# Patient Record
Sex: Female | Born: 1985 | Race: Black or African American | Hispanic: No | Marital: Single | Smoking: Former smoker
Health system: Southern US, Community
[De-identification: ages and names within clinical notes are randomized; demographics above are authoritative.]

## PROBLEM LIST (undated history)

## (undated) ENCOUNTER — Emergency Department (HOSPITAL_COMMUNITY): Admission: EM | Disposition: A | Discharge status: Home / Self Care | Payer: 59

## (undated) DIAGNOSIS — Q613 Polycystic kidney, unspecified: Secondary | ICD-10-CM

## (undated) DIAGNOSIS — I10 Essential (primary) hypertension: Secondary | ICD-10-CM

---
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
## 2007-05-27 ENCOUNTER — Emergency Department (HOSPITAL_COMMUNITY): Admission: EM | Admit: 2007-05-27 | Discharge: 2007-05-27 | Payer: Self-pay | Admitting: Emergency Medicine

## 2007-08-02 ENCOUNTER — Emergency Department (HOSPITAL_COMMUNITY): Admission: EM | Admit: 2007-08-02 | Discharge: 2007-08-02 | Payer: Self-pay | Admitting: Emergency Medicine

## 2008-06-08 ENCOUNTER — Emergency Department (HOSPITAL_COMMUNITY): Admission: EM | Admit: 2008-06-08 | Discharge: 2008-06-08 | Payer: Self-pay | Admitting: Emergency Medicine

## 2008-09-12 ENCOUNTER — Emergency Department (HOSPITAL_COMMUNITY): Admission: EM | Admit: 2008-09-12 | Discharge: 2008-09-12 | Payer: Self-pay | Admitting: Family Medicine

## 2009-02-04 ENCOUNTER — Emergency Department (HOSPITAL_COMMUNITY): Admission: EM | Admit: 2009-02-04 | Discharge: 2009-02-05 | Payer: Self-pay | Admitting: Emergency Medicine

## 2009-05-19 ENCOUNTER — Emergency Department (HOSPITAL_COMMUNITY): Admission: EM | Admit: 2009-05-19 | Discharge: 2009-05-19 | Payer: Self-pay | Admitting: Emergency Medicine

## 2009-07-03 ENCOUNTER — Inpatient Hospital Stay (HOSPITAL_COMMUNITY): Admission: AD | Admit: 2009-07-03 | Discharge: 2009-07-04 | Payer: Self-pay | Admitting: Obstetrics & Gynecology

## 2010-11-02 LAB — WET PREP, GENITAL: Trich, Wet Prep: NONE SEEN

## 2010-11-02 LAB — URINALYSIS, ROUTINE W REFLEX MICROSCOPIC
Leukocytes, UA: NEGATIVE
Urobilinogen, UA: 0.2 mg/dL (ref 0.0–1.0)

## 2010-11-02 LAB — URINE MICROSCOPIC-ADD ON

## 2010-11-04 LAB — URINALYSIS, ROUTINE W REFLEX MICROSCOPIC
Glucose, UA: NEGATIVE mg/dL
Nitrite: NEGATIVE
Specific Gravity, Urine: 1.01 (ref 1.005–1.030)
pH: 6 (ref 5.0–8.0)

## 2010-11-04 LAB — COMPREHENSIVE METABOLIC PANEL
Calcium: 9.2 mg/dL (ref 8.4–10.5)
Creatinine, Ser: 0.77 mg/dL (ref 0.4–1.2)
GFR calc Af Amer: >60 mL/min (ref >60)
Glucose, Bld: 83 mg/dL (ref 70–99)
Potassium: 4 mEq/L (ref 3.5–5.1)
Sodium: 138 mEq/L (ref 135–145)
Total Protein: 7.4 g/dL (ref 6.0–8.3)

## 2010-11-04 LAB — CBC
Hemoglobin: 13.3 g/dL (ref 12.0–15.0)
RBC: 4.42 MIL/uL (ref 3.87–5.11)

## 2010-11-04 LAB — URINE MICROSCOPIC-ADD ON

## 2010-11-04 LAB — LIPASE, BLOOD: Lipase: 25 U/L (ref 11–59)

## 2010-11-04 LAB — DIFFERENTIAL
Basophils Relative: 0 % (ref 0–1)
Monocytes Relative: 6 % (ref 3–12)
Neutrophils Relative %: 66 % (ref 43–77)

## 2010-11-07 LAB — DIFFERENTIAL
Eosinophils Relative: 2 % (ref 0–5)
Lymphocytes Relative: 23 % (ref 12–46)
Lymphs Abs: 2 10*3/uL (ref 0.7–4.0)
Monocytes Relative: 6 % (ref 3–12)

## 2010-11-07 LAB — URINALYSIS, ROUTINE W REFLEX MICROSCOPIC
Glucose, UA: NEGATIVE mg/dL
Ketones, ur: NEGATIVE mg/dL
pH: 6 (ref 5.0–8.0)

## 2010-11-07 LAB — CBC
HCT: 34.4 % — ABNORMAL LOW (ref 36.0–46.0)
Hemoglobin: 11.6 g/dL — ABNORMAL LOW (ref 12.0–15.0)
MCV: 88 fL (ref 78.0–100.0)
Platelets: 190 10*3/uL (ref 150–400)
RBC: 3.91 MIL/uL (ref 3.87–5.11)
WBC: 8.5 10*3/uL (ref 4.0–10.5)

## 2010-12-30 ENCOUNTER — Emergency Department (HOSPITAL_COMMUNITY): Payer: Self-pay

## 2010-12-30 ENCOUNTER — Emergency Department (HOSPITAL_COMMUNITY)
Admission: EM | Admit: 2010-12-30 | Discharge: 2010-12-31 | Disposition: A | Discharge status: Home / Self Care | Payer: Self-pay | Attending: Emergency Medicine | Admitting: Emergency Medicine

## 2010-12-30 DIAGNOSIS — R079 Chest pain, unspecified: Secondary | ICD-10-CM | POA: Insufficient documentation

## 2010-12-30 DIAGNOSIS — R0602 Shortness of breath: Secondary | ICD-10-CM | POA: Insufficient documentation

## 2010-12-30 DIAGNOSIS — R0989 Other specified symptoms and signs involving the circulatory and respiratory systems: Secondary | ICD-10-CM | POA: Insufficient documentation

## 2010-12-30 DIAGNOSIS — J45909 Unspecified asthma, uncomplicated: Secondary | ICD-10-CM | POA: Insufficient documentation

## 2010-12-30 DIAGNOSIS — R0609 Other forms of dyspnea: Secondary | ICD-10-CM | POA: Insufficient documentation

## 2010-12-30 DIAGNOSIS — M546 Pain in thoracic spine: Secondary | ICD-10-CM | POA: Insufficient documentation

## 2010-12-30 LAB — URINALYSIS, ROUTINE W REFLEX MICROSCOPIC
Hgb urine dipstick: NEGATIVE
Nitrite: NEGATIVE
Specific Gravity, Urine: 1.015 (ref 1.005–1.030)
Urobilinogen, UA: 0.2 mg/dL (ref 0.0–1.0)

## 2010-12-30 LAB — URINE MICROSCOPIC-ADD ON

## 2010-12-30 LAB — POCT PREGNANCY, URINE: Preg Test, Ur: NEGATIVE

## 2011-04-21 LAB — URINALYSIS, ROUTINE W REFLEX MICROSCOPIC
Bilirubin Urine: NEGATIVE
Glucose, UA: NEGATIVE
Hgb urine dipstick: NEGATIVE
Protein, ur: NEGATIVE

## 2011-04-21 LAB — COMPREHENSIVE METABOLIC PANEL
Albumin: 3.8
BUN: 7
Creatinine, Ser: 0.9
Total Bilirubin: 0.8
Total Protein: 7

## 2011-04-21 LAB — CBC
HCT: 37.1
MCV: 85
Platelets: 244
RDW: 15.2

## 2011-04-21 LAB — DIFFERENTIAL
Basophils Absolute: 0
Lymphocytes Relative: 14
Monocytes Absolute: 0.4
Monocytes Relative: 4
Neutro Abs: 7.2
Neutrophils Relative %: 81 — ABNORMAL HIGH

## 2011-05-03 LAB — DIFFERENTIAL
Basophils Absolute: 0
Basophils Relative: 0
Eosinophils Absolute: 0.1
Eosinophils Relative: 2
Lymphocytes Relative: 24
Lymphs Abs: 1.3
Monocytes Absolute: 0.4
Monocytes Relative: 7
Neutro Abs: 3.7
Neutrophils Relative %: 67

## 2011-05-03 LAB — URINALYSIS, ROUTINE W REFLEX MICROSCOPIC
Bilirubin Urine: NEGATIVE
Glucose, UA: NEGATIVE
Hgb urine dipstick: NEGATIVE
Ketones, ur: NEGATIVE
Nitrite: NEGATIVE
Protein, ur: NEGATIVE
Specific Gravity, Urine: 1.02
Urobilinogen, UA: 1
pH: 7.5

## 2011-05-03 LAB — URINE CULTURE: Colony Count: 9000

## 2011-05-03 LAB — CBC
Hemoglobin: 11.9 — ABNORMAL LOW
MCHC: 33.3
RBC: 4.08
WBC: 5.6

## 2011-05-03 LAB — POCT PREGNANCY, URINE: Preg Test, Ur: NEGATIVE

## 2011-05-03 LAB — BASIC METABOLIC PANEL
CO2: 26
Calcium: 9
GFR calc Af Amer: >60
GFR calc non Af Amer: >60
Sodium: 135

## 2011-05-03 LAB — WET PREP, GENITAL: Yeast Wet Prep HPF POC: NONE SEEN

## 2011-05-03 LAB — GC/CHLAMYDIA PROBE AMP, GENITAL: GC Probe Amp, Genital: NEGATIVE

## 2011-05-11 LAB — PREGNANCY, URINE: Preg Test, Ur: NEGATIVE

## 2011-05-11 LAB — URINALYSIS, ROUTINE W REFLEX MICROSCOPIC
Ketones, ur: 40 — AB
Leukocytes, UA: NEGATIVE
Nitrite: NEGATIVE
Specific Gravity, Urine: 1.03
Urobilinogen, UA: 1
pH: 6

## 2011-05-11 LAB — BASIC METABOLIC PANEL
BUN: 7
CO2: 25
Calcium: 9.2
Chloride: 109
Creatinine, Ser: 0.85
GFR calc Af Amer: >60

## 2011-05-11 LAB — URINE MICROSCOPIC-ADD ON

## 2011-10-28 ENCOUNTER — Encounter (HOSPITAL_COMMUNITY): Payer: Self-pay | Admitting: *Deleted

## 2011-10-28 ENCOUNTER — Emergency Department (INDEPENDENT_AMBULATORY_CARE_PROVIDER_SITE_OTHER)
Admission: EM | Admit: 2011-10-28 | Discharge: 2011-10-28 | Disposition: A | Discharge status: Home / Self Care | Payer: BC Managed Care – PPO | Source: Home / Self Care | Attending: Emergency Medicine | Admitting: Emergency Medicine

## 2011-10-28 DIAGNOSIS — J329 Chronic sinusitis, unspecified: Secondary | ICD-10-CM

## 2011-10-28 HISTORY — DX: Essential (primary) hypertension: I10

## 2011-10-28 LAB — POCT RAPID STREP A: Streptococcus, Group A Screen (Direct): NEGATIVE

## 2011-10-28 MED ORDER — AMOXICILLIN 500 MG PO CAPS: 500.0000 mg | ORAL_CAPSULE | Freq: Three times a day (TID) | ORAL | Status: AC

## 2011-10-28 MED ORDER — AMOXICILLIN 500 MG PO CAPS: 500.0000 mg | ORAL_CAPSULE | Freq: Three times a day (TID) | ORAL | Status: DC

## 2011-10-28 NOTE — ED Provider Notes (Signed)
Medical screening examination/treatment/procedure(s) were performed by non-physician practitioner and as supervising physician I was immediately available for consultation/collaboration.  Leslee Home, M.D.   Reuben Likes, MD 10/28/11 2200

## 2011-10-28 NOTE — ED Provider Notes (Signed)
History     CSN: [id number]  Arrival date & time 10/28/11  1324   None     Chief Complaint  Patient presents with  . Sore Throat    (Consider location/radiation/quality/duration/timing/severity/associated sxs/prior treatment) Patient is a 26 y.o. female presenting with pharyngitis. The history is provided by the patient. No language interpreter was used.  Sore Throat This is a new problem. The current episode started more than 2 days ago. The problem occurs constantly. The problem has been gradually worsening. Pertinent negatives include no headaches. The symptoms are aggravated by nothing. The symptoms are relieved by nothing. She has tried nothing for the symptoms. The treatment provided no relief.  Pt complains of a sore throat and a cough.  Pt reports she has drainage from sinuses.  Pt losing voice  Past Medical History  Diagnosis Date  . Hypertension   . Asthma     History reviewed. No pertinent past surgical history.  History reviewed. No pertinent family history.  History  Substance Use Topics  . Smoking status: Current Some Day Smoker  . Smokeless tobacco: Not on file  . Alcohol Use: No    OB History    Grav Para Term Preterm Abortions TAB SAB Ect Mult Living                  Review of Systems  HENT: Positive for sore throat.   Neurological: Negative for headaches.  All other systems reviewed and are negative.    Allergies  Review of patient's allergies indicates no known allergies.  Home Medications  No current outpatient prescriptions on file.  BP 150/91  Pulse 80  Temp(Src) 97.9 F (36.6 C) (Oral)  Resp 14  SpO2 99%  LMP 10/05/2011  Physical Exam  Nursing note and vitals reviewed. Constitutional: She is oriented to person, place, and time. She appears well-developed and well-nourished.  HENT:  Head: Normocephalic and atraumatic.  Right Ear: External ear normal.  Left Ear: External ear normal.  Nose: Nose normal.  Mouth/Throat:  Oropharynx is clear and moist.       Throat erythematous,  No exudate,  Sinuses tender,  Eyes: Conjunctivae are normal. Pupils are equal, round, and reactive to light.  Neck: Neck supple.  Cardiovascular: Normal rate.   Pulmonary/Chest: Effort normal.  Abdominal: Soft.  Musculoskeletal: Normal range of motion.  Neurological: She is alert and oriented to person, place, and time.  Skin: Skin is warm.  Psychiatric: She has a normal mood and affect.    ED Course  Procedures (including critical care time)   Labs Reviewed  POCT RAPID STREP A ([facility] ONLY)   No results found.   No diagnosis found.    MDM  Rx for amoxicillian        Lonia Skinner Bradner, PA 10/28/11 [address] [place], [place] 10/28/11 [phone]

## 2011-10-28 NOTE — ED Notes (Signed)
Pt  Reports  Symptoms  Of  sorethroat  Hoarse voices  body  aches  And  A  Cough    X  3  Days     She  Is  Masked  And  In  A  pvt  Room  She  Is  Awake  And  Alert  Sitting  Upright on  Exam table

## 2011-10-28 NOTE — Discharge Instructions (Signed)
Sinusitis Sinuses are air pockets within the bones of your face. The growth of bacteria within a sinus leads to infection. The infection prevents the sinuses from draining. This infection is called sinusitis. SYMPTOMS  There will be different areas of pain depending on which sinuses have become infected.  The maxillary sinuses often produce pain beneath the eyes.   Frontal sinusitis may cause pain in the middle of the forehead and above the eyes.  Other problems (symptoms) include:  Toothaches.   Colored, pus-like (purulent) drainage from the nose.   Swelling, warmth, and tenderness over the sinus areas may be signs of infection.  TREATMENT  Sinusitis is most often determined by an exam.X-rays may be taken. If x-rays have been taken, make sure you obtain your results or find out how you are to obtain them. Your caregiver may give you medications (antibiotics). These are medications that will help kill the bacteria causing the infection. You may also be given a medication (decongestant) that helps to reduce sinus swelling.  HOME CARE INSTRUCTIONS   Only take over-the-counter or prescription medicines for pain, discomfort, or fever as directed by your caregiver.   Drink extra fluids. Fluids help thin the mucus so your sinuses can drain more easily.   Applying either moist heat or ice packs to the sinus areas may help relieve discomfort.   Use saline nasal sprays to help moisten your sinuses. The sprays can be found at your local drugstore.  SEEK IMMEDIATE MEDICAL CARE IF:  You have a fever.   You have increasing pain, severe headaches, or toothache.   You have nausea, vomiting, or drowsiness.   You develop unusual swelling around the face or trouble seeing.  MAKE SURE YOU:   Understand these instructions.   Will watch your condition.   Will get help right away if you are not doing well or get worse.  Document Released: 07/18/2005 Document Revised: 07/07/2011 Document Reviewed:  02/14/2007 [employer] Patient Information 2012 [employer].Sinusitis Sinuses are air pockets within the bones of your face. The growth of bacteria within a sinus leads to infection. The infection prevents the sinuses from draining. This infection is called sinusitis. SYMPTOMS  There will be different areas of pain depending on which sinuses have become infected.  The maxillary sinuses often produce pain beneath the eyes.   Frontal sinusitis may cause pain in the middle of the forehead and above the eyes.  Other problems (symptoms) include:  Toothaches.   Colored, pus-like (purulent) drainage from the nose.   Swelling, warmth, and tenderness over the sinus areas may be signs of infection.  TREATMENT  Sinusitis is most often determined by an exam.X-rays may be taken. If x-rays have been taken, make sure you obtain your results or find out how you are to obtain them. Your caregiver may give you medications (antibiotics). These are medications that will help kill the bacteria causing the infection. You may also be given a medication (decongestant) that helps to reduce sinus swelling.  HOME CARE INSTRUCTIONS   Only take over-the-counter or prescription medicines for pain, discomfort, or fever as directed by your caregiver.   Drink extra fluids. Fluids help thin the mucus so your sinuses can drain more easily.   Applying either moist heat or ice packs to the sinus areas may help relieve discomfort.   Use saline nasal sprays to help moisten your sinuses. The sprays can be found at your local drugstore.  SEEK IMMEDIATE MEDICAL CARE IF:  You have a fever.     You have increasing pain, severe headaches, or toothache.   You have nausea, vomiting, or drowsiness.   You develop unusual swelling around the face or trouble seeing.  MAKE SURE YOU:   Understand these instructions.   Will watch your condition.   Will get help right away if you are not doing well or get worse.  Document  Released: 07/18/2005 Document Revised: 07/07/2011 Document Reviewed: 02/14/2007 [employer] Patient Information 2012 [employer]. 

## 2011-11-26 ENCOUNTER — Emergency Department (INDEPENDENT_AMBULATORY_CARE_PROVIDER_SITE_OTHER): Payer: BC Managed Care – PPO

## 2011-11-26 ENCOUNTER — Encounter (HOSPITAL_COMMUNITY): Payer: Self-pay

## 2011-11-26 ENCOUNTER — Emergency Department (INDEPENDENT_AMBULATORY_CARE_PROVIDER_SITE_OTHER)
Admission: EM | Admit: 2011-11-26 | Discharge: 2011-11-26 | Disposition: A | Discharge status: Home / Self Care | Payer: BC Managed Care – PPO | Source: Home / Self Care

## 2011-11-26 DIAGNOSIS — M545 Low back pain, unspecified: Secondary | ICD-10-CM

## 2011-11-26 HISTORY — DX: Polycystic kidney, unspecified: Q61.3

## 2011-11-26 LAB — POCT PREGNANCY, URINE: Preg Test, Ur: NEGATIVE

## 2011-11-26 MED ORDER — MORPHINE SULFATE 2 MG/ML IJ SOLN
2.0000 mg | Freq: Once | INTRAMUSCULAR | Status: AC
  Administered 2011-11-26: 2 mg via INTRAMUSCULAR

## 2011-11-26 MED ORDER — OXYCODONE-ACETAMINOPHEN 5-325 MG PO TABS: 1.0000 | ORAL_TABLET | ORAL | Status: AC | PRN

## 2011-11-26 MED ORDER — CYCLOBENZAPRINE HCL 10 MG PO TABS: 10.0000 mg | ORAL_TABLET | Freq: Three times a day (TID) | ORAL | Status: AC | PRN

## 2011-11-26 MED ORDER — MELOXICAM 15 MG PO TABS: 15.0000 mg | ORAL_TABLET | Freq: Every day | ORAL | Status: AC

## 2011-11-26 MED ORDER — MORPHINE SULFATE 2 MG/ML IJ SOLN
INTRAMUSCULAR | Status: AC
  Filled 2011-11-26: qty 1

## 2011-11-26 NOTE — ED Provider Notes (Signed)
Abigail Mora is a 26 y.o. female who presents to Urgent Care today for low back pain today starting at 1240.  Patient had acute onset of low back pain that does not radiate today. She denies any recent injury.  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  As a child she had back pain on and off that was never evaluated. Today for her pain she has tried Tylenol but that was not helpful. She denies any fevers chills weakness numbness difficulty walking or bowel or bladder dysfunction. She notes that having a hip flexed and back flexed position is most comfortable. Her pain is worse with back extension.   PMH reviewed. As noted above history of back pain on and off as a child that was never evaluated. Current smoker ROS as above otherwise neg.  no chest pains, palpitations, fevers, chills, abdominal pain nausea or vomiting. Medications reviewed. Current Facility-Administered Medications  Medication Dose Route Frequency Provider Last Rate Last Dose  . morphine 2 MG/ML injection 2 mg  2 mg Intramuscular Once Rodolph Bong, MD   2 mg at 11/26/11 1826   Current Outpatient Prescriptions  Medication Sig Dispense Refill  . acetaminophen (TYLENOL) 500 MG tablet Take 500 mg by mouth every 6 (six) hours as needed.      . cyclobenzaprine (FLEXERIL) 10 MG tablet Take 1 tablet (10 mg total) by mouth 3 (three) times daily as needed for muscle spasms.  30 tablet  0  . meloxicam (MOBIC) 15 MG tablet Take 1 tablet (15 mg total) by mouth daily.  14 tablet  0  . oxyCODONE-acetaminophen (ROXICET) 5-325 MG per tablet Take 1 tablet by mouth every 4 (four) hours as needed for pain.  15 tablet  0    Exam:  BP 115/70  Pulse 70  Temp(Src) 98.2 F (36.8 C) (Oral)  Resp 22  SpO2 99%  LMP 11/09/2011 Gen: Well NAD, in pain appearing BACK: Patient is laying on the exam table with her hips flexed.  Tender over lower lumbar and upper sacral midline. Additionally tender over the bilateral SI joints. Reflexes are normal and  equal bilaterally as is strength and sensation. Patient walks with a back flexed position  Results for orders placed during the hospital encounter of 11/26/11 (from the past 24 hour(s))  POCT PREGNANCY, URINE     Status: Normal   Collection Time   11/26/11  6:21 PM      Component Value Range   Preg Test, Ur NEGATIVE  NEGATIVE    Dg Lumbar Spine Complete  11/26/2011  *RADIOLOGY REPORT*  Clinical Data: Sudden onset low back pain today.  No known injury.  LUMBAR SPINE - COMPLETE 4+ VIEW  Comparison: Abdomen and pelvis CT dated 05/27/2007.  Findings: Transitional thoracolumbar and lumbosacral vertebrae with four intervening lumbar vertebrae.  These have normal appearances with the exception of mildly progressive discogenic sclerosis in the anterior, inferior aspect of the last lumbar vertebra prior to the transitional lumbosacral vertebra.  No fractures or subluxations.  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 Otherwise, unremarkable examination.  Original Report Authenticated By: Darrol Angel, M.D.    Assessment and Plan: 26 y.o. female with lumbar back pain.  X-rays were not diagnostic. She was tender over the midline therefore felt that x-rays are warranted. With rule out a fracture or serious spondylolisthesis I feel that the most likely etiology is muscle spasm. Plan to treat symptomatically with meloxicam, Flexeril, and small amount of  oxycodone. I've advised her to followup with her primary care doctor within one or 2 weeks especially if she does not improved. Discussed warning signs or symptoms with patient that would prompt return to health care. She expresses understanding. Please see discharge instructions.     Rodolph Bong, MD 11/26/11 [phone]

## 2011-11-26 NOTE — ED Notes (Signed)
Pt has low back pain that started at 1230 today, pt was sitting and no know injury

## 2011-11-26 NOTE — Discharge Instructions (Signed)
Thank you for coming in today. I think you have a back strain.  Please take the meloxicam and Flexeril.  Use the Percocet as needed for extreme back pain.  Take it easy, but don't lay in bed all day.  Followup with your doctor in one or 2 weeks if you do not get better. Come back or go to the emergency room if you notice new weakness new numbness problems walking or bowel or bladder problems.  Back Pain, Adult Low back pain is very common. About 1 in 5 people have back pain.The cause of low back pain is rarely dangerous. The pain often gets better over time.About half of people with a sudden onset of back pain feel better in just 2 weeks. About 8 in 10 people feel better by 6 weeks.  CAUSES Some common causes of back pain include:  Strain of the muscles or ligaments supporting the spine.   Wear and tear (degeneration) of the spinal discs.   Arthritis.   Direct injury to the back.  DIAGNOSIS Most of the time, the direct cause of low back pain is not known.However, back pain can be treated effectively even when the exact cause of the pain is unknown.Answering your caregiver's questions about your overall health and symptoms is one of the most accurate ways to make sure the cause of your pain is not dangerous. If your caregiver needs more information, he or she may order lab work or imaging tests (X-rays or MRIs).However, even if imaging tests show changes in your back, this usually does not require surgery. HOME CARE INSTRUCTIONS For many people, back pain returns.Since low back pain is rarely dangerous, it is often a condition that people can learn to [facility] their own.   Remain active. It is stressful on the back to sit or stand in one place. Do not sit, drive, or stand in one place for more than 30 minutes at a time. Take short walks on level surfaces as soon as pain allows.Try to increase the length of time you walk each day.   Do not stay in bed.Resting more than 1 or 2 days  can delay your recovery.   Do not avoid exercise or work.Your body is made to move.It is not dangerous to be active, even though your back may hurt.Your back will likely heal faster if you return to being active before your pain is gone.   Pay attention to your body when you bend and lift. Many people have less discomfortwhen lifting if they bend their knees, keep the load close to their bodies,and avoid twisting. Often, the most comfortable positions are those that put less stress on your recovering back.   Find a comfortable position to sleep. Use a firm mattress and lie on your side with your knees slightly bent. If you lie on your back, put a pillow under your knees.   Only take over-the-counter or prescription medicines as directed by your caregiver. Over-the-counter medicines to reduce pain and inflammation are often the most helpful.Your caregiver may prescribe muscle relaxant drugs.These medicines help dull your pain so you can more quickly return to your normal activities and healthy exercise.   Put ice on the injured area.   Put ice in a plastic bag.   Place a towel between your skin and the bag.   Leave the ice on for 15 to 20 minutes, 3 to 4 times a day for the first 2 to 3 days. After that, ice and heat may be  alternated to reduce pain and spasms.   Ask your caregiver about trying back exercises and gentle massage. This may be of some benefit.   Avoid feeling anxious or stressed.Stress increases muscle tension and can worsen back pain.It is important to recognize when you are anxious or stressed and learn ways to manage it.Exercise is a great option.  SEEK MEDICAL CARE IF:  You have pain that is not relieved with rest or medicine.   You have pain that does not improve in 1 week.   You have new symptoms.   You are generally not feeling well.  SEEK IMMEDIATE MEDICAL CARE IF:   You have pain that radiates from your back into your legs.   You develop new bowel or  bladder control problems.   You have unusual weakness or numbness in your arms or legs.   You develop nausea or vomiting.   You develop abdominal pain.   You feel faint.  Document Released: 07/18/2005 Document Revised: 07/07/2011 Document Reviewed: 12/06/2010 [facility] Patient Information 2012 [place], [place].

## 2011-11-26 NOTE — ED Provider Notes (Signed)
Medical screening examination/treatment/procedure(s) were performed by a resident physician and as supervising physician I was immediately available for consultation/collaboration.  Leslee Home, M.D.   Reuben Likes, MD 11/26/11 [phone]

## 2012-12-12 ENCOUNTER — Emergency Department (HOSPITAL_COMMUNITY)
Admission: EM | Admit: 2012-12-12 | Discharge: 2012-12-12 | Disposition: A | Discharge status: Home / Self Care | Payer: PRIVATE HEALTH INSURANCE | Attending: Emergency Medicine | Admitting: Emergency Medicine

## 2012-12-12 ENCOUNTER — Encounter (HOSPITAL_COMMUNITY): Payer: Self-pay | Admitting: Emergency Medicine

## 2012-12-12 DIAGNOSIS — H53149 Visual discomfort, unspecified: Secondary | ICD-10-CM | POA: Insufficient documentation

## 2012-12-12 DIAGNOSIS — R51 Headache: Secondary | ICD-10-CM | POA: Insufficient documentation

## 2012-12-12 DIAGNOSIS — Q613 Polycystic kidney, unspecified: Secondary | ICD-10-CM | POA: Insufficient documentation

## 2012-12-12 DIAGNOSIS — I10 Essential (primary) hypertension: Secondary | ICD-10-CM | POA: Insufficient documentation

## 2012-12-12 DIAGNOSIS — F17200 Nicotine dependence, unspecified, uncomplicated: Secondary | ICD-10-CM | POA: Insufficient documentation

## 2012-12-12 DIAGNOSIS — R630 Anorexia: Secondary | ICD-10-CM | POA: Insufficient documentation

## 2012-12-12 DIAGNOSIS — J45909 Unspecified asthma, uncomplicated: Secondary | ICD-10-CM | POA: Insufficient documentation

## 2012-12-12 MED ORDER — ACETAMINOPHEN 500 MG PO TABS: 500.0000 mg | ORAL_TABLET | Freq: Four times a day (QID) | ORAL | Status: DC | PRN

## 2012-12-12 MED ORDER — ACETAMINOPHEN 500 MG PO TABS
1000.0000 mg | ORAL_TABLET | Freq: Once | ORAL | Status: AC
  Administered 2012-12-12: 1000 mg via ORAL
  Filled 2012-12-12: qty 2

## 2012-12-12 MED ORDER — IBUPROFEN 800 MG PO TABS: 800.0000 mg | ORAL_TABLET | Freq: Three times a day (TID) | ORAL | Status: DC

## 2012-12-12 MED ORDER — IBUPROFEN 400 MG PO TABS
800.0000 mg | ORAL_TABLET | Freq: Once | ORAL | Status: AC
  Administered 2012-12-12: 800 mg via ORAL
  Filled 2012-12-12: qty 2

## 2012-12-12 NOTE — ED Notes (Signed)
Pt states she has a headache to both sides of head. Pt describes headache as pressure. Pt states she took aspirin and clonodine around 9pm. Pt rates headache 7/10. Pt states she has headaches but has never been dx with migraines. Pt reports sensitivity to light.

## 2012-12-12 NOTE — ED Notes (Signed)
PT. REPORTS HEADACHE ONSET 6 PM THIS EVENING , DENIES INJURY , SLIGHT NAUSEA , DENIES FEVER OR BLURRED VISION .

## 2012-12-12 NOTE — ED Provider Notes (Signed)
History    This chart was scribed for non-physician practitioner Junius Finner working with Celene Kras, MD by Quintella Reichert, ED Scribe. This patient was seen in room TR08C/TR08C and the patient's care was started at 10:47 PM .   CSN: [id number]  Arrival date & time 12/12/12  2140      Chief Complaint  Patient presents with  . Headache     The history is provided by the patient. No language interpreter was used.    HPI Comments: Abigail Mora is a 27 y.o. female who presents to the Emergency Department complaining of constant, severe headache that began 4 hours ago.  Pt states she has h/o intermittent headaches with accompanying high BP.  At admission pt's BP is 149/99.  She describes pain as pressure, "like someone is taking an air tire pump and filling up my head."  She states her current symptoms feel similar to past HA episodes.  She also reports mild photophobia and decreased appetite.  She mentions that she has been hydrating well today.  Pt took aspirin and clonidine 2 hours ago, with minimal relief.  Pt denies nausea, numbness, paraesthesia, fever or any other associated symptoms.. Pt has h/o HTN, polycystic kidney disease and asthma.  She denies any medications.   She denies medication allergies.  Pt is not driving home today.   Past Medical History  Diagnosis Date  . Hypertension   . Asthma   . Polycystic kidney disease     History reviewed. No pertinent past surgical history.  No family history on file.  History  Substance Use Topics  . Smoking status: Current Some Day Smoker  . Smokeless tobacco: Not on file  . Alcohol Use: Yes    OB History   Grav Para Term Preterm Abortions TAB SAB Ect Mult Living                  Review of Systems  Constitutional: Negative for fever.  Eyes: Positive for photophobia.  Gastrointestinal: Negative for nausea.  Neurological: Positive for headaches. Negative for numbness.  All other systems reviewed and are  negative.    Allergies  Review of patient's allergies indicates no known allergies.  Home Medications   Current Outpatient Rx  Name  Route  Sig  Dispense  Refill  . ibuprofen (ADVIL,MOTRIN) 200 MG tablet   Oral   Take 200 mg by mouth every 6 (six) hours as needed for pain. For headache pain         . acetaminophen (TYLENOL) 500 MG tablet   Oral   Take 1 tablet (500 mg total) by mouth every 6 (six) hours as needed for pain.   30 tablet   0   . ibuprofen (ADVIL,MOTRIN) 800 MG tablet   Oral   Take 1 tablet (800 mg total) by mouth 3 (three) times daily.   21 tablet   0     BP 149/99  Pulse 59  Temp(Src) 98.4 F (36.9 C) (Oral)  Resp 18  SpO2 100%  LMP 12/07/2012  Physical Exam  Nursing note and vitals reviewed. Constitutional: She is oriented to person, place, and time. She appears well-developed and well-nourished. No distress.  Pt lying in exam bed with towel over her eyes  HENT:  Head: Normocephalic and atraumatic.  Eyes: Conjunctivae and EOM are normal. Pupils are equal, round, and reactive to light. Right eye exhibits no discharge. Left eye exhibits no discharge. No scleral icterus.  PERRL, EOM nl  Neck:  Normal range of motion. Neck supple. No tracheal deviation present.  No nuchal rigidity or meningeal signs  Cardiovascular: Normal rate, regular rhythm and normal heart sounds.   No murmur heard. Pulmonary/Chest: Effort normal and breath sounds normal. No respiratory distress. She has no wheezes. She has no rales.  Musculoskeletal: Normal range of motion.  Neurological: She is alert and oriented to person, place, and time. She has normal strength. No cranial nerve deficit or sensory deficit. She exhibits normal muscle tone. Coordination and gait normal.  CN II-XII in tact, no focal deficit, nl gait  Skin: Skin is warm and dry. No rash noted.  Psychiatric: She has a normal mood and affect. Her behavior is normal.    ED Course  Procedures (including critical  care time)  DIAGNOSTIC STUDIES: Oxygen Saturation is 100% on room air, normal by my interpretation.    COORDINATION OF CARE: 10:50 PM-Discussed treatment plan which includes pain medication with pt at bedside and pt agreed to plan.      Labs Reviewed - No data to display No results found.   1. Headache       MDM  Pt c/o headache that started earlier today, similar to headaches in past.  Hast tried aspirin and clonidine w/ minimal relief.  Pt reports elevated BP at home and noticed it is associated with her headaches. BP here is 149/99.  Neuro exam: nl. CN II-XII in tact, nl coordination, able to ambulate. PERRL, EOM nl.  No nuchal rigidity or meningeal signs. During stay in ED, pt left her own room b/c person she was with was aggravating her.  Advised pt she needs to rest and try to keep stress under control.  It could be contributing to headaches and high bp.    Rx: ibuprofen and acetaminophen, discharged home. F/u with pcp as needed for headaches and regular health maintenance. I personally performed the services described in this documentation, which was scribed in my presence. The recorded information has been reviewed and is accurate. Vitals: unremarkable. Discharged in stable condition.       MDM Number of Diagnoses or Management Options Headache:             Junius Finner, PA-C 12/13/12 1349

## 2012-12-13 NOTE — ED Provider Notes (Signed)
Medical screening examination/treatment/procedure(s) were performed by non-physician practitioner and as supervising physician I was immediately available for consultation/collaboration.     Celene Kras, MD 12/13/12 [phone]

## 2012-12-18 ENCOUNTER — Institutional Professional Consult (permissible substitution): Payer: Self-pay | Admitting: Medical

## 2013-01-04 ENCOUNTER — Emergency Department (HOSPITAL_COMMUNITY)
Admission: EM | Admit: 2013-01-04 | Discharge: 2013-01-04 | Disposition: A | Discharge status: Home / Self Care | Payer: PRIVATE HEALTH INSURANCE | Source: Home / Self Care

## 2013-01-04 ENCOUNTER — Encounter (HOSPITAL_COMMUNITY): Payer: Self-pay | Admitting: *Deleted

## 2013-01-04 DIAGNOSIS — M7661 Achilles tendinitis, right leg: Secondary | ICD-10-CM

## 2013-01-04 DIAGNOSIS — M7660 Achilles tendinitis, unspecified leg: Secondary | ICD-10-CM

## 2013-01-04 MED ORDER — DICLOFENAC SODIUM 50 MG PO TBEC: 50.0000 mg | DELAYED_RELEASE_TABLET | Freq: Two times a day (BID) | ORAL | Status: DC

## 2013-01-04 NOTE — ED Notes (Signed)
Pt reports right ankle pain the past month

## 2013-01-04 NOTE — ED Notes (Signed)
Applied 2" coban wrap at verbal direction of NP after a determination that a gel/air splint would be unlikely to provide the limitation of movement indicated by the injury.

## 2013-01-04 NOTE — ED Provider Notes (Signed)
Medical screening examination/treatment/procedure(s) were performed by non-physician practitioner and as supervising physician I was immediately available for consultation/collaboration.  Raynald Blend, MD 01/04/13 1340

## 2013-01-04 NOTE — ED Provider Notes (Signed)
History     CSN: [id number]  Arrival date & time 01/04/13  1122   First MD Initiated Contact with Patient 01/04/13 1219      Chief Complaint  Patient presents with  . Ankle Pain    (Consider location/radiation/quality/duration/timing/severity/associated sxs/prior treatment) HPI Comments: 27 year old female reports pain in the right Achilles tendon for approximately one month. The pain is located along the mid posterior Achilles tendon on the right. Denies any known injury. She is not a runner nor has she been exercising. 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 In the past month this pain is been getting worse.   Past Medical History  Diagnosis Date  . Hypertension   . Asthma   . Polycystic kidney disease     No past surgical history on file.  No family history on file.  History  Substance Use Topics  . Smoking status: Current Some Day Smoker  . Smokeless tobacco: Not on file  . Alcohol Use: Yes     Comment: occasionally    OB History   Grav Para Term Preterm Abortions TAB SAB Ect Mult Living                  Review of Systems  Constitutional: Negative for fever, chills and activity change.  HENT: Negative.   Respiratory: Negative.   Cardiovascular: Negative.   Musculoskeletal: Positive for gait problem.       As per HPI  Skin: Negative for color change, pallor and rash.  Neurological: Negative.     Allergies  Review of patient's allergies indicates no known allergies.  Home Medications   Current Outpatient Rx  Name  Route  Sig  Dispense  Refill  . acetaminophen (TYLENOL) 500 MG tablet   Oral   Take 1 tablet (500 mg total) by mouth every 6 (six) hours as needed for pain.   30 tablet   0   . diclofenac (VOLTAREN) 50 MG EC tablet   Oral   Take 1 tablet (50 mg total) by mouth 2 (two) times daily. Take with food.   20 tablet   0   . ibuprofen (ADVIL,MOTRIN) 200 MG tablet   Oral   Take 200 mg by mouth every  6 (six) hours as needed for pain. For headache pain         . ibuprofen (ADVIL,MOTRIN) 800 MG tablet   Oral   Take 1 tablet (800 mg total) by mouth 3 (three) times daily.   21 tablet   0     BP 151/94  Pulse 77  Resp 17  SpO2 100%  LMP 12/09/2012  Physical Exam  Nursing note and vitals reviewed. Constitutional: She is oriented to person, place, and time. She appears well-developed and well-nourished. No distress.  HENT:  Head: Normocephalic and atraumatic.  Eyes: EOM are normal.  Neck: Normal range of motion. Neck supple.  Cardiovascular: Normal rate.   Pulmonary/Chest: Effort normal. No respiratory distress.  Musculoskeletal: She exhibits no edema.  No observable deformity, swelling or discoloration over the right Achilles tendon. Tenderness is primarily along the posterior aspect of the mid tendon. No plantar tenderness. Flexion and extension is intact. Distal neurovascular motor sensory is intact. Pedal pulse 2+.  Neurological: She is alert and oriented to person, place, and time. No cranial nerve deficit.  Skin: Skin is warm and dry.  Psychiatric: She has a normal mood and affect.    ED Course  Procedures (  including critical care time)  Labs Reviewed - No data to display No results found.   1. Achilles tendinitis, right       MDM  Coban type wrap to  Ankle to assist in  Immobilization and support. Recommend limited amount of time spent walking if possible. Ice to the area of pain along the Achilles tendon 4-5 times a day 20 to 30 minutes at a time. Diclofenac 50 mg 3 times a day with food when necessary pain Instructions for rehabilitation.        Hayden Rasmussen, NP 01/04/13 1242

## 2013-05-13 ENCOUNTER — Encounter (HOSPITAL_COMMUNITY): Payer: Self-pay | Admitting: Emergency Medicine

## 2013-05-13 ENCOUNTER — Emergency Department (HOSPITAL_COMMUNITY)
Admission: EM | Admit: 2013-05-13 | Discharge: 2013-05-13 | Disposition: A | Discharge status: Home / Self Care | Payer: PRIVATE HEALTH INSURANCE | Attending: Emergency Medicine | Admitting: Emergency Medicine

## 2013-05-13 ENCOUNTER — Emergency Department (HOSPITAL_COMMUNITY): Payer: PRIVATE HEALTH INSURANCE

## 2013-05-13 DIAGNOSIS — J069 Acute upper respiratory infection, unspecified: Secondary | ICD-10-CM

## 2013-05-13 DIAGNOSIS — J45901 Unspecified asthma with (acute) exacerbation: Secondary | ICD-10-CM | POA: Insufficient documentation

## 2013-05-13 DIAGNOSIS — Z791 Long term (current) use of non-steroidal anti-inflammatories (NSAID): Secondary | ICD-10-CM | POA: Insufficient documentation

## 2013-05-13 DIAGNOSIS — I10 Essential (primary) hypertension: Secondary | ICD-10-CM | POA: Insufficient documentation

## 2013-05-13 DIAGNOSIS — F17200 Nicotine dependence, unspecified, uncomplicated: Secondary | ICD-10-CM | POA: Insufficient documentation

## 2013-05-13 MED ORDER — ALBUTEROL SULFATE (5 MG/ML) 0.5% IN NEBU
5.0000 mg | INHALATION_SOLUTION | Freq: Once | RESPIRATORY_TRACT | Status: AC
  Administered 2013-05-13: 5 mg via RESPIRATORY_TRACT
  Filled 2013-05-13: qty 1

## 2013-05-13 MED ORDER — IPRATROPIUM BROMIDE 0.02 % IN SOLN
0.5000 mg | Freq: Once | RESPIRATORY_TRACT | Status: AC
  Administered 2013-05-13: 0.5 mg via RESPIRATORY_TRACT
  Filled 2013-05-13: qty 2.5

## 2013-05-13 MED ORDER — ALBUTEROL SULFATE HFA 108 (90 BASE) MCG/ACT IN AERS: 1.0000 | INHALATION_SPRAY | Freq: Four times a day (QID) | RESPIRATORY_TRACT | Status: DC | PRN

## 2013-05-13 MED ORDER — PROMETHAZINE-CODEINE 6.25-10 MG/5ML PO SYRP: 5.0000 mL | ORAL_SOLUTION | ORAL | Status: DC | PRN

## 2013-05-13 NOTE — ED Provider Notes (Signed)
CSN: [id number]     Arrival date & time 05/13/13  0701 History   First MD Initiated Contact with Patient 05/13/13 0710     Chief Complaint  Patient presents with  . URI   (Consider location/radiation/quality/duration/timing/severity/associated sxs/prior Treatment) Patient is a 27 y.o. female presenting with URI. The history is provided by the patient and medical records.  URI Presenting symptoms: congestion and cough    This is a 27 year old female with PMH significant for HTN, asthma, PCKD, presenting to the ED for productive cough, head and chest congestion.  No hemoptysis.  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  No fevers, sweats, or chills.  Has taken numerous OTC cold medicines and cough syrups without relief.  Pt states she has not used albuterol inhalers in several years.  Past Medical History  Diagnosis Date  . Hypertension   . Asthma   . Polycystic kidney disease    History reviewed. No pertinent past surgical history. No family history on file. History  Substance Use Topics  . Smoking status: Current Some Day Smoker  . Smokeless tobacco: Not on file  . Alcohol Use: Yes     Comment: occasionally   OB History   Grav Para Term Preterm Abortions TAB SAB Ect Mult Living                 Review of Systems  HENT: Positive for congestion and sinus pressure.   Respiratory: Positive for cough.   All other systems reviewed and are negative.    Allergies  Review of patient's allergies indicates no known allergies.  Home Medications   Current Outpatient Rx  Name  Route  Sig  Dispense  Refill  . acetaminophen (TYLENOL) 500 MG tablet   Oral   Take 1 tablet (500 mg total) by mouth every 6 (six) hours as needed for pain.   30 tablet   0   . diclofenac (VOLTAREN) 50 MG EC tablet   Oral   Take 1 tablet (50 mg total) by mouth 2 (two) times daily. Take with food.   20 tablet   0   . ibuprofen (ADVIL,MOTRIN) 200 MG tablet   Oral  Take 200 mg by mouth every 6 (six) hours as needed for pain. For headache pain         . ibuprofen (ADVIL,MOTRIN) 800 MG tablet   Oral   Take 1 tablet (800 mg total) by mouth 3 (three) times daily.   21 tablet   0    BP 168/90  Pulse 82  Temp(Src) 98.2 F (36.8 C)  Resp 20  SpO2 100%  Physical Exam  Nursing note and vitals reviewed. Constitutional: She is oriented to person, place, and time. She appears well-developed and well-nourished. No distress.  HENT:  Head: Normocephalic and atraumatic.  Right Ear: Tympanic membrane and ear canal normal.  Left Ear: Tympanic membrane and ear canal normal.  Nose: Mucosal edema present. Right sinus exhibits maxillary sinus tenderness. Left sinus exhibits maxillary sinus tenderness.  Mouth/Throat: Uvula is midline, oropharynx is clear and moist and mucous membranes are normal. No oropharyngeal exudate, posterior oropharyngeal edema, posterior oropharyngeal erythema or tonsillar abscesses.  Turbinates swollen and erythematous, some PND in posterior oropharynx  Eyes: Conjunctivae and EOM are normal. Pupils are equal, round, and reactive to light.  Neck: Normal range of motion. Neck supple.  Cardiovascular: Normal rate, regular rhythm and normal heart sounds.   Pulmonary/Chest: Effort normal. No respiratory distress. She  has wheezes.  Wheezes at bilateral bases  Musculoskeletal: Normal range of motion.  Neurological: She is alert and oriented to person, place, and time.  Skin: Skin is warm and dry. She is not diaphoretic.  Psychiatric: She has a normal mood and affect.    ED Course  Procedures (including critical care time) Labs Review Labs Reviewed - No data to display Imaging Review Dg Chest 2 View  05/13/2013   CLINICAL DATA:  Cough  EXAM: CHEST  2 VIEW  COMPARISON:  12/31/2010  FINDINGS: Normal cardiac and mediastinal contours. No consolidative pulmonary opacities. No effusion or pneumothorax. Regional skeleton is unremarkable.   IMPRESSION: No acute cardiopulmonary process.   Electronically Signed   By: Annia Belt M.D.   On: 05/13/2013 07:33    EKG Interpretation   None       MDM   1. Viral URI with cough    Chest x-ray clear. Likely viral URI. Wheezing improved following albuterol and Atrovent neb. Pt afebrile, non-toxic appearing, NAD, VS stable- ok for discharge.  Rx Phenergan with codeine syrup and albuterol inhaler. Patient will followup [facility] if problems occur. Discussed plan with patient, she agreed. Return precautions advised.   Garlon Hatchet, PA-C 05/13/13 [phone]

## 2013-05-13 NOTE — ED Provider Notes (Signed)
Medical screening examination/treatment/procedure(s) were performed by non-physician practitioner and as supervising physician I was immediately available for consultation/collaboration.  Darlys Gales, MD 05/13/13 2145

## 2013-05-13 NOTE — ED Notes (Addendum)
Cough congestion chest and facial x 1 week coughing up a lot of thick mucus OTC meds not helping

## 2013-06-14 ENCOUNTER — Encounter (HOSPITAL_COMMUNITY): Payer: Self-pay | Admitting: Emergency Medicine

## 2013-06-14 ENCOUNTER — Emergency Department (INDEPENDENT_AMBULATORY_CARE_PROVIDER_SITE_OTHER)
Admission: EM | Admit: 2013-06-14 | Discharge: 2013-06-14 | Disposition: A | Discharge status: Home / Self Care | Payer: PRIVATE HEALTH INSURANCE | Source: Home / Self Care | Attending: Emergency Medicine | Admitting: Emergency Medicine

## 2013-06-14 DIAGNOSIS — M549 Dorsalgia, unspecified: Secondary | ICD-10-CM

## 2013-06-14 MED ORDER — KETOROLAC TROMETHAMINE 30 MG/ML IJ SOLN
INTRAMUSCULAR | Status: AC
  Filled 2013-06-14: qty 1

## 2013-06-14 MED ORDER — NAPROXEN 500 MG PO TABS: 500.0000 mg | ORAL_TABLET | Freq: Two times a day (BID) | ORAL | Status: DC

## 2013-06-14 MED ORDER — TRAMADOL HCL 50 MG PO TABS: 50.0000 mg | ORAL_TABLET | Freq: Four times a day (QID) | ORAL | Status: DC | PRN

## 2013-06-14 MED ORDER — KETOROLAC TROMETHAMINE 60 MG/2ML IM SOLN
60.0000 mg | Freq: Once | INTRAMUSCULAR | Status: AC
  Administered 2013-06-14: 60 mg via INTRAMUSCULAR

## 2013-06-14 NOTE — ED Provider Notes (Signed)
CSN: [id number]     Arrival date & time 06/14/13  1611 History   First MD Initiated Contact with Patient 06/14/13 1703     Chief Complaint  Patient presents with  . Back Pain   (Consider location/radiation/quality/duration/timing/severity/associated sxs/prior Treatment) HPI Comments: 27 year old female presents complaining of back pain. This is been going on for one week. She had a viral upper respiratory infection a couple of weeks ago, during which time she developed diffuse back pain. Her respiratory infection has gotten better and and so has the upper back pain, but the lower back pain persists. The pain is constant and is exacerbated by any movement. She has had issues with her lower back before in the past. She denies any loss of bowel or bladder control and denies any numbness or weakness in her extremities  Patient is a 27 y.o. female presenting with back pain.  Back Pain Associated symptoms: no abdominal pain, no chest pain, no dysuria, no fever and no weakness     Past Medical History  Diagnosis Date  . Hypertension   . Asthma   . Polycystic kidney disease    History reviewed. No pertinent past surgical history. History reviewed. No pertinent family history. History  Substance Use Topics  . Smoking status: Current Some Day Smoker  . Smokeless tobacco: Not on file  . Alcohol Use: Yes     Comment: occasionally   OB History   Grav Para Term Preterm Abortions TAB SAB Ect Mult Living                 Review of Systems  Constitutional: Negative for fever and chills.  Eyes: Negative for visual disturbance.  Respiratory: Positive for cough. Negative for shortness of breath.   Cardiovascular: Negative for chest pain, palpitations and leg swelling.  Gastrointestinal: Negative for nausea, vomiting and abdominal pain.  Endocrine: Negative for polydipsia and polyuria.  Genitourinary: Negative for dysuria, urgency and frequency.  Musculoskeletal: Positive for back pain.  Negative for arthralgias and myalgias.  Skin: Negative for rash.  Neurological: Negative for dizziness, weakness and light-headedness.    Allergies  Review of patient's allergies indicates no known allergies.  Home Medications   Current Outpatient Rx  Name  Route  Sig  Dispense  Refill  . albuterol (PROVENTIL HFA;VENTOLIN HFA) 108 (90 BASE) MCG/ACT inhaler   Inhalation   Inhale 1-2 puffs into the lungs every 6 (six) hours as needed for wheezing.   1 Inhaler   0   . guaiFENesin (MUCINEX) 600 MG 12 hr tablet   Oral   Take 1,200 mg by mouth once.         . naproxen (NAPROSYN) 500 MG tablet   Oral   Take 1 tablet (500 mg total) by mouth 2 (two) times daily.   60 tablet   0   . promethazine-codeine (PHENERGAN WITH CODEINE) 6.25-10 MG/5ML syrup   Oral   Take 5 mLs by mouth every 4 (four) hours as needed for cough.   120 mL   0   . Pseudoephedrine HCl (SUDAFED PO)   Oral   Take 1 tablet by mouth once.         . traMADol (ULTRAM) 50 MG tablet   Oral   Take 1-2 tablets (50-100 mg total) by mouth every 6 (six) hours as needed.   30 tablet   0    BP 158/95  Pulse 84  Temp(Src) 99.2 F (37.3 C) (Oral)  Resp 14  SpO2 99%  LMP 05/29/2013 Physical Exam  Nursing note and vitals reviewed. Constitutional: She is oriented to person, place, and time. Vital signs are normal. She appears well-developed and well-nourished. No distress.  HENT:  Head: Normocephalic and atraumatic.  Pulmonary/Chest: Effort normal. No respiratory distress. She has wheezes (she is a smoker) in the right lower field and the left lower field.  Musculoskeletal:       Lumbar back: She exhibits decreased range of motion, tenderness, bony tenderness, pain and spasm. She exhibits no swelling.  Neurological: She is alert and oriented to person, place, and time. She has normal strength. Coordination normal.  Skin: Skin is warm and dry. No rash noted. She is not diaphoretic.  Psychiatric: She has a normal  mood and affect. Judgment normal.    ED Course  Procedures (including critical care time) Labs Review Labs Reviewed - No data to display Imaging Review No results found.    MDM   1. Back pain, acute    Toradol given here. Will discharge with naproxen and tramadol. Advised to stay active. Followup prn   Meds ordered this encounter  Medications  . ketorolac (TORADOL) injection 60 mg    Sig:   . naproxen (NAPROSYN) 500 MG tablet    Sig: Take 1 tablet (500 mg total) by mouth 2 (two) times daily.    Dispense:  60 tablet    Refill:  0    Order Specific Question:  Supervising Provider    Answer:  Lorenz Coaster, DAVID C [id number]  . traMADol (ULTRAM) 50 MG tablet    Sig: Take 1-2 tablets (50-100 mg total) by mouth every 6 (six) hours as needed.    Dispense:  30 tablet    Refill:  0    Order Specific Question:  Supervising Provider    Answer:  Lorenz Coaster, DAVID C [[id number]]       Graylon Good, PA-C 06/14/13 1825

## 2013-06-14 NOTE — ED Notes (Signed)
C/o lower back pain x 1 wk.  States "I do a lot of pushing and pulling at work"  Denies known injury  Denies urinary symptoms.  Pt has tried ibuprofen and warm soaks with no relief in pain.

## 2013-06-14 NOTE — ED Provider Notes (Signed)
Medical screening examination/treatment/procedure(s) were performed by non-physician practitioner and as supervising physician I was immediately available for consultation/collaboration.  Leslee Home, M.D.  Reuben Likes, MD 06/14/13 [phone]

## 2013-12-30 ENCOUNTER — Emergency Department (INDEPENDENT_AMBULATORY_CARE_PROVIDER_SITE_OTHER): Payer: Self-pay

## 2013-12-30 ENCOUNTER — Emergency Department (HOSPITAL_COMMUNITY)
Admission: EM | Admit: 2013-12-30 | Discharge: 2013-12-30 | Disposition: A | Discharge status: Home / Self Care | Payer: Self-pay | Source: Home / Self Care | Attending: Emergency Medicine | Admitting: Emergency Medicine

## 2013-12-30 ENCOUNTER — Encounter (HOSPITAL_COMMUNITY): Payer: Self-pay | Admitting: Emergency Medicine

## 2013-12-30 DIAGNOSIS — M79609 Pain in unspecified limb: Secondary | ICD-10-CM

## 2013-12-30 DIAGNOSIS — M79673 Pain in unspecified foot: Secondary | ICD-10-CM

## 2013-12-30 MED ORDER — MELOXICAM 15 MG PO TABS: 15.0000 mg | ORAL_TABLET | Freq: Every day | ORAL | Status: DC

## 2013-12-30 NOTE — ED Provider Notes (Signed)
CSN: [id number]     Arrival date & time 12/30/13  0957 History   First MD Initiated Contact with Patient 12/30/13 1117     Chief Complaint  Patient presents with  . Foot Pain   (Consider location/radiation/quality/duration/timing/severity/associated sxs/prior Treatment) HPI Comments: 28 year old female presents for evaluation of left foot pain. This has been present since Friday. She has pain around her first MTP is very mild when she is sitting still, but becomes much more severe with weightbearing activity. It is worse the longer she walks on it. She denies any injury and has never had this before. 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 The day after this started, she also started to have pain in her lower posterior lateral shin. Overall, the pain has been progressively worsening since it began. No numbness or swelling in the foot. She has never had this before. No new shoes. No recent increased level of physical activity. No significant history.  Patient is a 28 y.o. female presenting with lower extremity pain.  Foot Pain    Past Medical History  Diagnosis Date  . Hypertension   . Asthma   . Polycystic kidney disease    History reviewed. No pertinent past surgical history. No family history on file. History  Substance Use Topics  . Smoking status: Current Some Day Smoker -- 0.50 packs/day    Types: Cigarettes  . Smokeless tobacco: Not on file  . Alcohol Use: Yes     Comment: occasionally   OB History   Grav Para Term Preterm Abortions TAB SAB Ect Mult Living                 Review of Systems  Musculoskeletal:       See history of present illness  All other systems reviewed and are negative.   Allergies  Review of patient's allergies indicates no known allergies.  Home Medications   Prior to Admission medications   Medication Sig Start Date End Date Taking? Authorizing Provider  albuterol (PROVENTIL HFA;VENTOLIN HFA) 108 (90  BASE) MCG/ACT inhaler Inhale 1-2 puffs into the lungs every 6 (six) hours as needed for wheezing. 05/13/13   Garlon Hatchet, PA-C  guaiFENesin (MUCINEX) 600 MG 12 hr tablet Take 1,200 mg by mouth once.    Historical Provider, MD  meloxicam (MOBIC) 15 MG tablet Take 1 tablet (15 mg total) by mouth daily. 12/30/13   Graylon Good, PA-C  naproxen (NAPROSYN) 500 MG tablet Take 1 tablet (500 mg total) by mouth 2 (two) times daily. 06/14/13   Graylon Good, PA-C  promethazine-codeine (PHENERGAN WITH CODEINE) 6.25-10 MG/5ML syrup Take 5 mLs by mouth every 4 (four) hours as needed for cough. 05/13/13   Garlon Hatchet, PA-C  Pseudoephedrine HCl (SUDAFED PO) Take 1 tablet by mouth once.    Historical Provider, MD  traMADol (ULTRAM) 50 MG tablet Take 1-2 tablets (50-100 mg total) by mouth every 6 (six) hours as needed. 06/14/13   Adrian Blackwater Karter Hellmer, PA-C   BP 140/97  Pulse 81  Temp(Src) 98.6 F (37 C) (Oral)  Resp 18  SpO2 100%  LMP 12/04/2013 Physical Exam  Nursing note and vitals reviewed. Constitutional: She is oriented to person, place, and time. Vital signs are normal. She appears well-developed and well-nourished. No distress.  HENT:  Head: Normocephalic and atraumatic.  Cardiovascular:  Pulses:      Dorsalis pedis pulses are 2+ on the right side.  Pulmonary/Chest:  Effort normal. No respiratory distress.  Musculoskeletal:       Left lower leg: She exhibits tenderness (mild, posterior lateral lower third of calf ).       Left foot: She exhibits tenderness. She exhibits normal range of motion and normal capillary refill.       Feet:  Neurological: She is alert and oriented to person, place, and time. She has normal strength and normal reflexes. No sensory deficit. Coordination and gait normal.  Skin: Skin is warm and dry. No rash noted. She is not diaphoretic.  Psychiatric: She has a normal mood and affect. Judgment normal.    ED Course  Procedures (including critical care time) Labs  Review Labs Reviewed - No data to display  Imaging Review Dg Foot Complete Left  12/30/2013   CLINICAL DATA:  Foot pain at the first metatarsophalangeal joint.  EXAM: LEFT FOOT - COMPLETE 3+ VIEW  COMPARISON:  None.  FINDINGS: No acute osseous or joint abnormality. No periosteal reaction. No degenerative change.  IMPRESSION: No findings to explain the patient's pain.   Electronically Signed   By: Leanna BattlesMelinda  Blietz M.D.   On: 12/30/2013 11:58     MDM   1. Foot pain    Arthritis versus stress fracture, versus sesamoiditis. Placed in a postop shoe, daily meloxicam. Fourth toe still having pain of one-week.   Meds ordered this encounter  Medications  . meloxicam (MOBIC) 15 MG tablet    Sig: Take 1 tablet (15 mg total) by mouth daily.    Dispense:  30 tablet    Refill:  2    Order Specific Question:  Supervising Provider    Answer:  Lorenz CoasterKELLER, DAVID C [[id number]]      Graylon GoodZachary H Lyon Dumont, PA-C 12/30/13 1205

## 2013-12-30 NOTE — ED Notes (Signed)
Pt c/o left foot pain onset 4 days Denies inj/trauma Reports she is standing on her feet all day long at work Pain increases when bearing wt  Alert w/no signs of acute distress; ambulated well to exam room w/NAD

## 2013-12-31 NOTE — ED Provider Notes (Signed)
Medical screening examination/treatment/procedure(s) were performed by non-physician practitioner and as supervising physician I was immediately available for consultation/collaboration.  Leslee Home, M.D.  Reuben Likes, MD 12/31/13 2213

## 2014-05-14 ENCOUNTER — Emergency Department (HOSPITAL_COMMUNITY)
Admission: EM | Admit: 2014-05-14 | Discharge: 2014-05-14 | Disposition: A | Discharge status: Home / Self Care | Payer: Self-pay | Attending: Emergency Medicine | Admitting: Emergency Medicine

## 2014-05-14 ENCOUNTER — Encounter (HOSPITAL_COMMUNITY): Payer: Self-pay | Admitting: Emergency Medicine

## 2014-05-14 DIAGNOSIS — Z720 Tobacco use: Secondary | ICD-10-CM | POA: Insufficient documentation

## 2014-05-14 DIAGNOSIS — J45909 Unspecified asthma, uncomplicated: Secondary | ICD-10-CM | POA: Insufficient documentation

## 2014-05-14 DIAGNOSIS — I10 Essential (primary) hypertension: Secondary | ICD-10-CM | POA: Insufficient documentation

## 2014-05-14 DIAGNOSIS — Q613 Polycystic kidney, unspecified: Secondary | ICD-10-CM | POA: Insufficient documentation

## 2014-05-14 DIAGNOSIS — G8929 Other chronic pain: Secondary | ICD-10-CM | POA: Insufficient documentation

## 2014-05-14 DIAGNOSIS — M545 Low back pain, unspecified: Secondary | ICD-10-CM

## 2014-05-14 MED ORDER — METHOCARBAMOL 500 MG PO TABS
1000.0000 mg | ORAL_TABLET | Freq: Once | ORAL | Status: AC
  Administered 2014-05-14: 1000 mg via ORAL
  Filled 2014-05-14: qty 2

## 2014-05-14 MED ORDER — METHOCARBAMOL 750 MG PO TABS: 750.0000 mg | ORAL_TABLET | Freq: Four times a day (QID) | ORAL | Status: DC | PRN

## 2014-05-14 NOTE — ED Notes (Signed)
Per pt sts lower back pain and tailbone pain. sts she felt something pulling back there. denies any vaginal, abdominal or urinary complaints.

## 2014-05-14 NOTE — ED Provider Notes (Signed)
CSN: [id number]     Arrival date & time 05/14/14  [id number] History   First MD Initiated Contact with Patient 05/14/14 0746     Chief Complaint  Patient presents with  . Back Pain     (Consider location/radiation/quality/duration/timing/severity/associated sxs/prior Treatment) The history is provided by the patient.    Patient with hx low back pain, presents with exacerbation of her chronic back pain.  Pain is located in her lower back with radiation into bilateral buttocks.  The pain began yesterday as she was stepping out of the shower.   Pain described as bilateral "pulling" around the middle of her back, worse with lying flat, moving and twisting.   Denies fevers, chills, abdominal pain, loss of control of bowel or bladder, weakness of numbness of the extremities, saddle anesthesia, bowel, urinary, or vaginal complaints.  Denies IV drug or history of cancer.     Past Medical History  Diagnosis Date  . Hypertension   . Asthma   . Polycystic kidney disease    History reviewed. No pertinent past surgical history. History reviewed. No pertinent family history. History  Substance Use Topics  . Smoking status: Current Some Day Smoker -- 0.50 packs/day    Types: Cigarettes  . Smokeless tobacco: Not on file  . Alcohol Use: Yes     Comment: occasionally   OB History   Grav Para Term Preterm Abortions TAB SAB Ect Mult Living                 Review of Systems  All other systems reviewed and are negative.     Allergies  Review of patient's allergies indicates no known allergies.  Home Medications   Prior to Admission medications   Medication Sig Start Date End Date Taking? Authorizing Provider  ibuprofen (ADVIL,MOTRIN) 200 MG tablet Take 800 mg by mouth every 6 (six) hours as needed (pain).   Yes Historical Provider, MD   BP 150/95  Pulse 80  Temp(Src) 97.9 F (36.6 C) (Oral)  Resp 16  SpO2 100%  LMP 04/24/2014 Physical Exam  Nursing note and vitals  reviewed. Constitutional: She appears well-developed and well-nourished. No distress.  HENT:  Head: Normocephalic and atraumatic.  Neck: Neck supple.  Pulmonary/Chest: Effort normal.  Abdominal: Soft. She exhibits no distension and no mass. There is no tenderness. There is no rebound and no guarding.  Musculoskeletal: Normal range of motion. She exhibits no edema.  Bilateral paraspinal tenderness through T and L spine.  No crepitus, focal bony tenderness, or step offs.   Lower extremities:  Strength 5/5, sensation intact, distal pulses intact.     Neurological: She is alert.  Skin: She is not diaphoretic.    ED Course  Procedures (including critical care time) Labs Review Labs Reviewed - No data to display  Imaging Review No results found.   EKG Interpretation None      MDM   Final diagnoses:  Bilateral low back pain without sciatica    Afebrile, nontoxic patient with low back pain, paraspinal tenderness, worse with movement.  Pt is CNA, does heavy lifting.  No specific injury.  Likely muscular.  No red flags for back pain.   D/C home with robaxin, continue ibuprofen, conservative measures at home.  Discussed findings, treatment, and follow up  with patient.  Pt given return precautions.  Pt verbalizes understanding and agrees with plan.         Trixie Dredgemily Kiev Labrosse, PA-C 05/14/14 1146

## 2014-05-14 NOTE — ED Provider Notes (Signed)
Medical screening examination/treatment/procedure(s) were performed by non-physician practitioner and as supervising physician I was immediately available for consultation/collaboration.   EKG Interpretation None        Brannan Cassedy, MD 05/14/14 1152 

## 2014-05-14 NOTE — Discharge Instructions (Signed)
Read the information below.  Use the prescribed medication as directed.  Please discuss all new medications with your pharmacist.  You may return to the Emergency Department at any time for worsening condition or any new symptoms that concern you.  If there is any possibility that you might be pregnant, please let your health care provider know and discuss this with the pharmacist to ensure medication safety.   If you develop fevers, loss of control of bowel or bladder, weakness or numbness in your legs, or are unable to walk, return to the ER for a recheck.    Back Pain, Adult Low back pain is very common. About 1 in 5 people have back pain.The cause of low back pain is rarely dangerous. The pain often gets better over time.About half of people with a sudden onset of back pain feel better in just 2 weeks. About 8 in 10 people feel better by 6 weeks.  CAUSES Some common causes of back pain include:  Strain of the muscles or ligaments supporting the spine.  Wear and tear (degeneration) of the spinal discs.  Arthritis.  Direct injury to the back. DIAGNOSIS Most of the time, the direct cause of low back pain is not known.However, back pain can be treated effectively even when the exact cause of the pain is unknown.Answering your caregiver's questions about your overall health and symptoms is one of the most accurate ways to make sure the cause of your pain is not dangerous. If your caregiver needs more information, he or she may order lab work or imaging tests (X-rays or MRIs).However, even if imaging tests show changes in your back, this usually does not require surgery. HOME CARE INSTRUCTIONS For many people, back pain returns.Since low back pain is rarely dangerous, it is often a condition that people can learn to [facility] their own.   Remain active. It is stressful on the back to sit or stand in one place. Do not sit, drive, or stand in one place for more than 30 minutes at a time. Take  short walks on level surfaces as soon as pain allows.Try to increase the length of time you walk each day.  Do not stay in bed.Resting more than 1 or 2 days can delay your recovery.  Do not avoid exercise or work.Your body is made to move.It is not dangerous to be active, even though your back may hurt.Your back will likely heal faster if you return to being active before your pain is gone.  Pay attention to your body when you bend and lift. Many people have less discomfortwhen lifting if they bend their knees, keep the load close to their bodies,and avoid twisting. Often, the most comfortable positions are those that put less stress on your recovering back.  Find a comfortable position to sleep. Use a firm mattress and lie on your side with your knees slightly bent. If you lie on your back, put a pillow under your knees.  Only take over-the-counter or prescription medicines as directed by your caregiver. Over-the-counter medicines to reduce pain and inflammation are often the most helpful.Your caregiver may prescribe muscle relaxant drugs.These medicines help dull your pain so you can more quickly return to your normal activities and healthy exercise.  Put ice on the injured area.  Put ice in a plastic bag.  Place a towel between your skin and the bag.  Leave the ice on for 15-20 minutes, 03-04 times a day for the first 2 to 3 days. After  that, ice and heat may be alternated to reduce pain and spasms.  Ask your caregiver about trying back exercises and gentle massage. This may be of some benefit.  Avoid feeling anxious or stressed.Stress increases muscle tension and can worsen back pain.It is important to recognize when you are anxious or stressed and learn ways to manage it.Exercise is a great option. SEEK MEDICAL CARE IF:  You have pain that is not relieved with rest or medicine.  You have pain that does not improve in 1 week.  You have new symptoms.  You are generally  not feeling well. SEEK IMMEDIATE MEDICAL CARE IF:   You have pain that radiates from your back into your legs.  You develop new bowel or bladder control problems.  You have unusual weakness or numbness in your arms or legs.  You develop nausea or vomiting.  You develop abdominal pain.  You feel faint. Document Released: 07/18/2005 Document Revised: 01/17/2012 Document Reviewed: 11/19/2013 [facility] Patient Information 2015 [place], [place]. This information is not intended to replace advice given to you by your health care provider. Make sure you discuss any questions you have with your health care provider.  Back Injury Prevention Back injuries can be extremely painful and difficult to heal. After having one back injury, you are much more likely to experience another later on. It is important to learn how to avoid injuring or re-injuring your back. The following tips can help you to prevent a back injury. PHYSICAL FITNESS  Exercise regularly and try to develop good tone in your abdominal muscles. Your abdominal muscles provide a lot of the support needed by your back.  Do aerobic exercises (walking, jogging, biking, swimming) regularly.  Do exercises that increase balance and strength (tai chi, yoga) regularly. This can decrease your risk of falling and injuring your back.  Stretch before and after exercising.  Maintain a healthy weight. The more you weigh, the more stress is placed on your back. For every pound of weight, 10 times that amount of pressure is placed on the back. DIET  Talk to your caregiver about how much calcium and vitamin D you need per day. These nutrients help to prevent weakening of the bones (osteoporosis). Osteoporosis can cause broken (fractured) bones that lead to back pain.  Include good sources of calcium in your diet, such as dairy products, green, leafy vegetables, and products with calcium added (fortified).  Include good sources of vitamin D in your  diet, such as milk and foods that are fortified with vitamin D.  Consider taking a nutritional supplement or a multivitamin if needed.  Stop smoking if you smoke. POSTURE  Sit and stand up straight. Avoid leaning forward when you sit or hunching over when you stand.  Choose chairs with good low back (lumbar) support.  If you work at a desk, sit close to your work so you do not need to lean over. Keep your chin tucked in. Keep your neck drawn back and elbows bent at a right angle. Your arms should look like the letter "L."  Sit high and close to the steering wheel when you drive. Add a lumbar support to your car seat if needed.  Avoid sitting or standing in one position for too long. Take breaks to get up, stretch, and walk around at least once every hour. Take breaks if you are driving for long periods of time.  Sleep on your side with your knees slightly bent, or sleep on your back with a  pillow under your knees. Do not sleep on your stomach. LIFTING, TWISTING, AND REACHING  Avoid heavy lifting, especially repetitive lifting. If you must do heavy lifting:  Stretch before lifting.  Work slowly.  Rest between lifts.  Use carts and dollies to move objects when possible.  Make several small trips instead of carrying 1 heavy load.  Ask for help when you need it.  Ask for help when moving big, awkward objects.  Follow these steps when lifting:  Stand with your feet shoulder-width apart.  Get as close to the object as you can. Do not try to pick up heavy objects that are far from your body.  Use handles or lifting straps if they are available.  Bend at your knees. Squat down, but keep your heels off the floor.  Keep your shoulders pulled back, your chin tucked in, and your back straight.  Lift the object slowly, tightening the muscles in your legs, abdomen, and buttocks. Keep the object as close to the center of your body as possible.  When you put a load down, use these  same guidelines in reverse.  Do not:  Lift the object above your waist.  Twist at the waist while lifting or carrying a load. Move your feet if you need to turn, not your waist.  Bend over without bending at your knees.  Avoid reaching over your head, across a table, or for an object on a high surface. OTHER TIPS  Avoid wet floors and keep sidewalks clear of ice to prevent falls.  Do not sleep on a mattress that is too soft or too hard.  Keep items that are used frequently within easy reach.  Put heavier objects on shelves at waist level and lighter objects on lower or higher shelves.  Find ways to decrease your stress, such as exercise, massage, or relaxation techniques. Stress can build up in your muscles. Tense muscles are more vulnerable to injury.  Seek treatment for depression or anxiety if needed. These conditions can increase your risk of developing back pain. SEEK MEDICAL CARE IF:  You injure your back.  You have questions about diet, exercise, or other ways to prevent back injuries. MAKE SURE YOU:  Understand these instructions.  Will watch your condition.  Will get help right away if you are not doing well or get worse. Document Released: 08/25/2004 Document Revised: 10/10/2011 Document Reviewed: 08/29/2011 [facility] Patient Information 2015 [place], [place]. This information is not intended to replace advice given to you by your health care provider. Make sure you discuss any questions you have with your health care provider.

## 2015-01-21 ENCOUNTER — Emergency Department (HOSPITAL_COMMUNITY)
Admission: EM | Admit: 2015-01-21 | Discharge: 2015-01-21 | Discharge status: Against Medical Advice | Payer: Self-pay | Attending: Emergency Medicine | Admitting: Emergency Medicine

## 2015-01-21 ENCOUNTER — Encounter (HOSPITAL_COMMUNITY): Payer: Self-pay | Admitting: *Deleted

## 2015-01-21 DIAGNOSIS — J45909 Unspecified asthma, uncomplicated: Secondary | ICD-10-CM | POA: Insufficient documentation

## 2015-01-21 DIAGNOSIS — Z720 Tobacco use: Secondary | ICD-10-CM | POA: Insufficient documentation

## 2015-01-21 DIAGNOSIS — M545 Low back pain: Secondary | ICD-10-CM | POA: Insufficient documentation

## 2015-01-21 DIAGNOSIS — Q613 Polycystic kidney, unspecified: Secondary | ICD-10-CM | POA: Insufficient documentation

## 2015-01-21 DIAGNOSIS — I10 Essential (primary) hypertension: Secondary | ICD-10-CM | POA: Insufficient documentation

## 2015-01-21 NOTE — ED Notes (Signed)
Pt presents via POV c/o lower back pain since Sunday.  Pt states she has an "imflammed muscle".   Pt states unknown what triggered the pain.  Pt a x 4, NAD.  Pt bent over d/t back pain.

## 2015-01-21 NOTE — ED Notes (Signed)
Pt still absent from room, unable to locate pt.

## 2015-01-21 NOTE — ED Notes (Signed)
Pt absent from room at this time, unable to locate.

## 2015-01-29 NOTE — ED Provider Notes (Signed)
Patient eloped from the emergency department before provider evaluation. I did not see or evaluate this patient. Triage note states he presented with CC of low back pain x 3 days with "inflammed muscle" with unknown etiology of back pain.   Pt was absent from his room, and was not located in fast track.  Danelle BerryLeisa Efrem Pitstick, PA-C      Danelle BerryLeisa Cleaster Shiffer, PA-C 01/29/15 [phone]  Derwood KaplanAnkit Nanavati, MD 02/03/15 [id number]

## 2015-05-18 IMAGING — CR DG CHEST 2V
2 series · 2 of 2 positions shown · non-contrast
Comparison: 12/31/2010

CLINICAL DATA: Cough

EXAM:
CHEST  2 VIEW

[w chest pa]
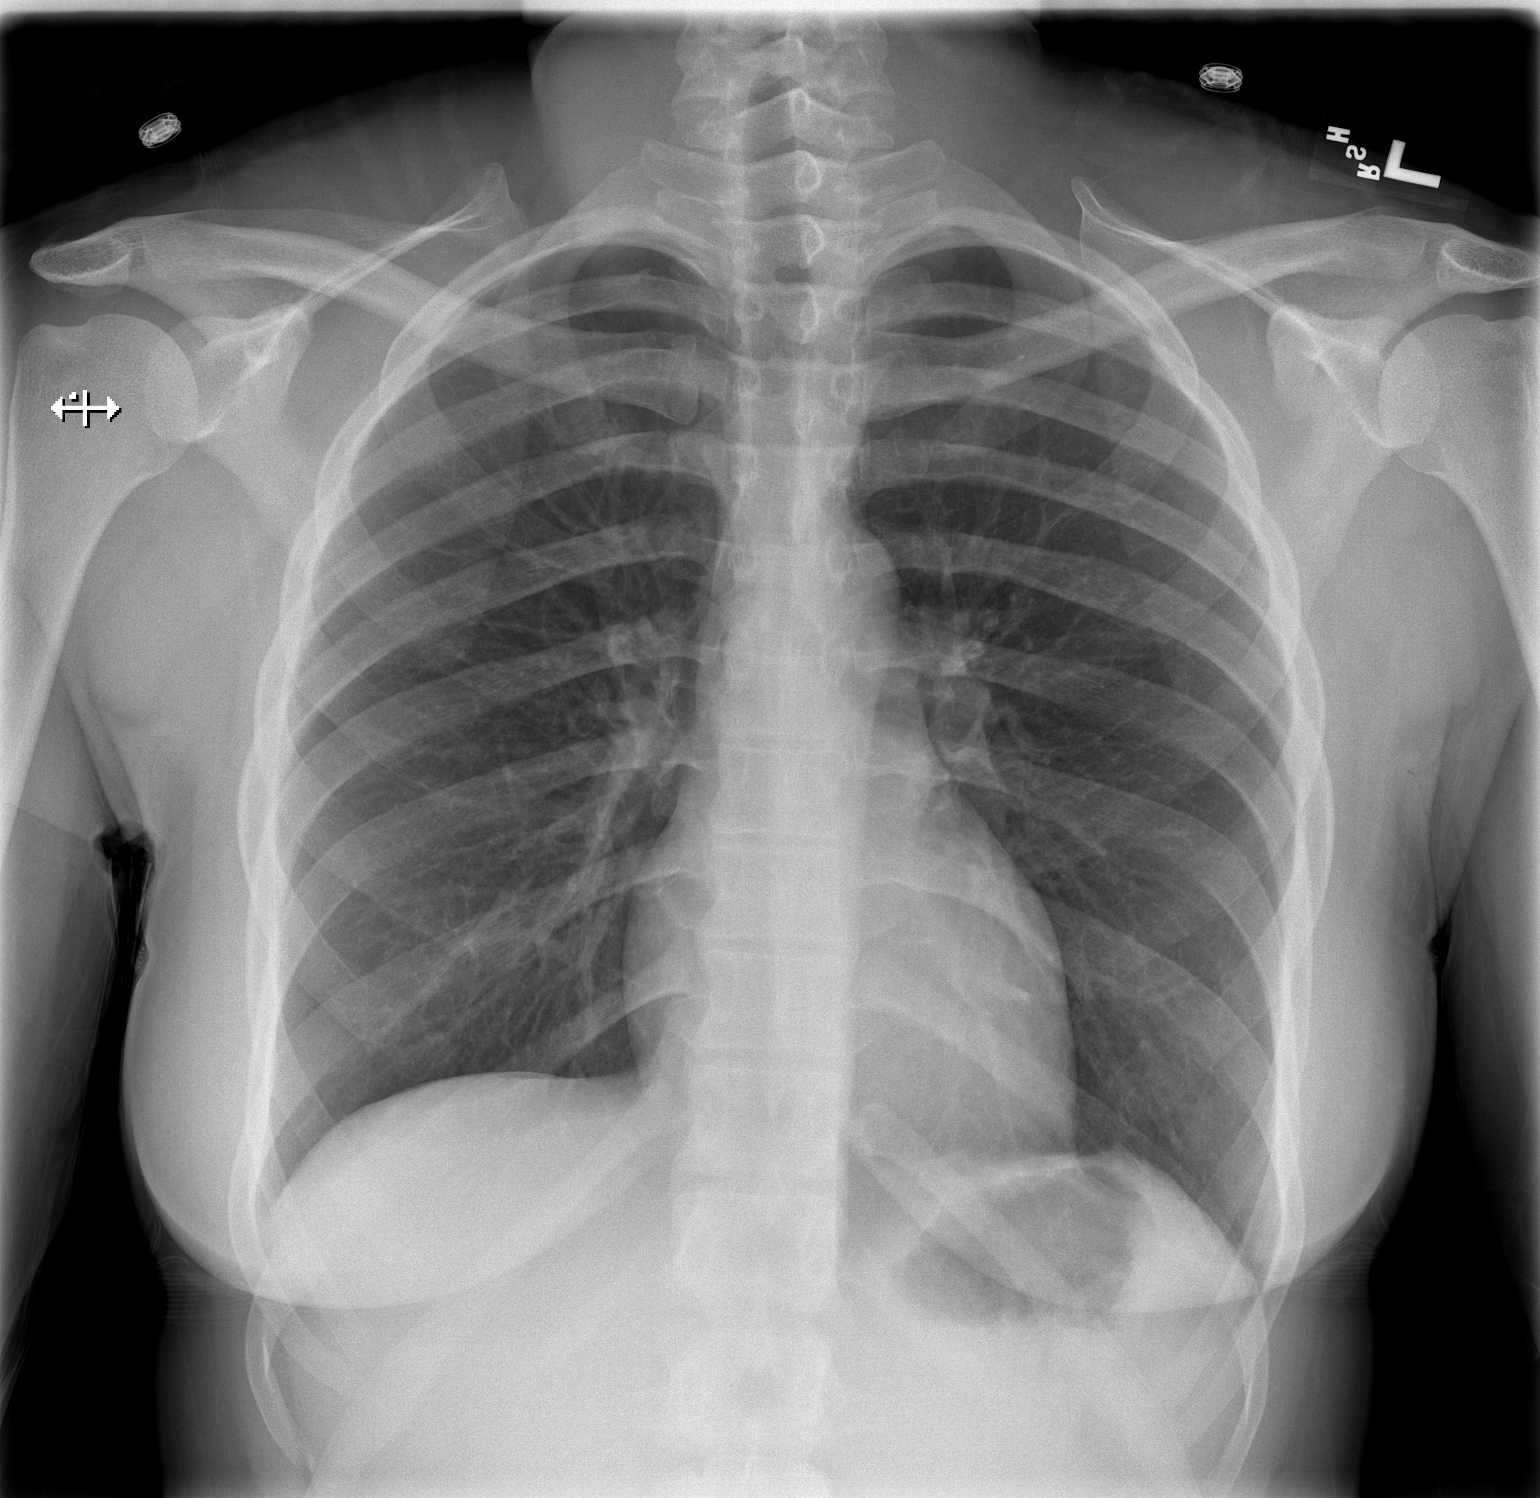

[w chest lat]
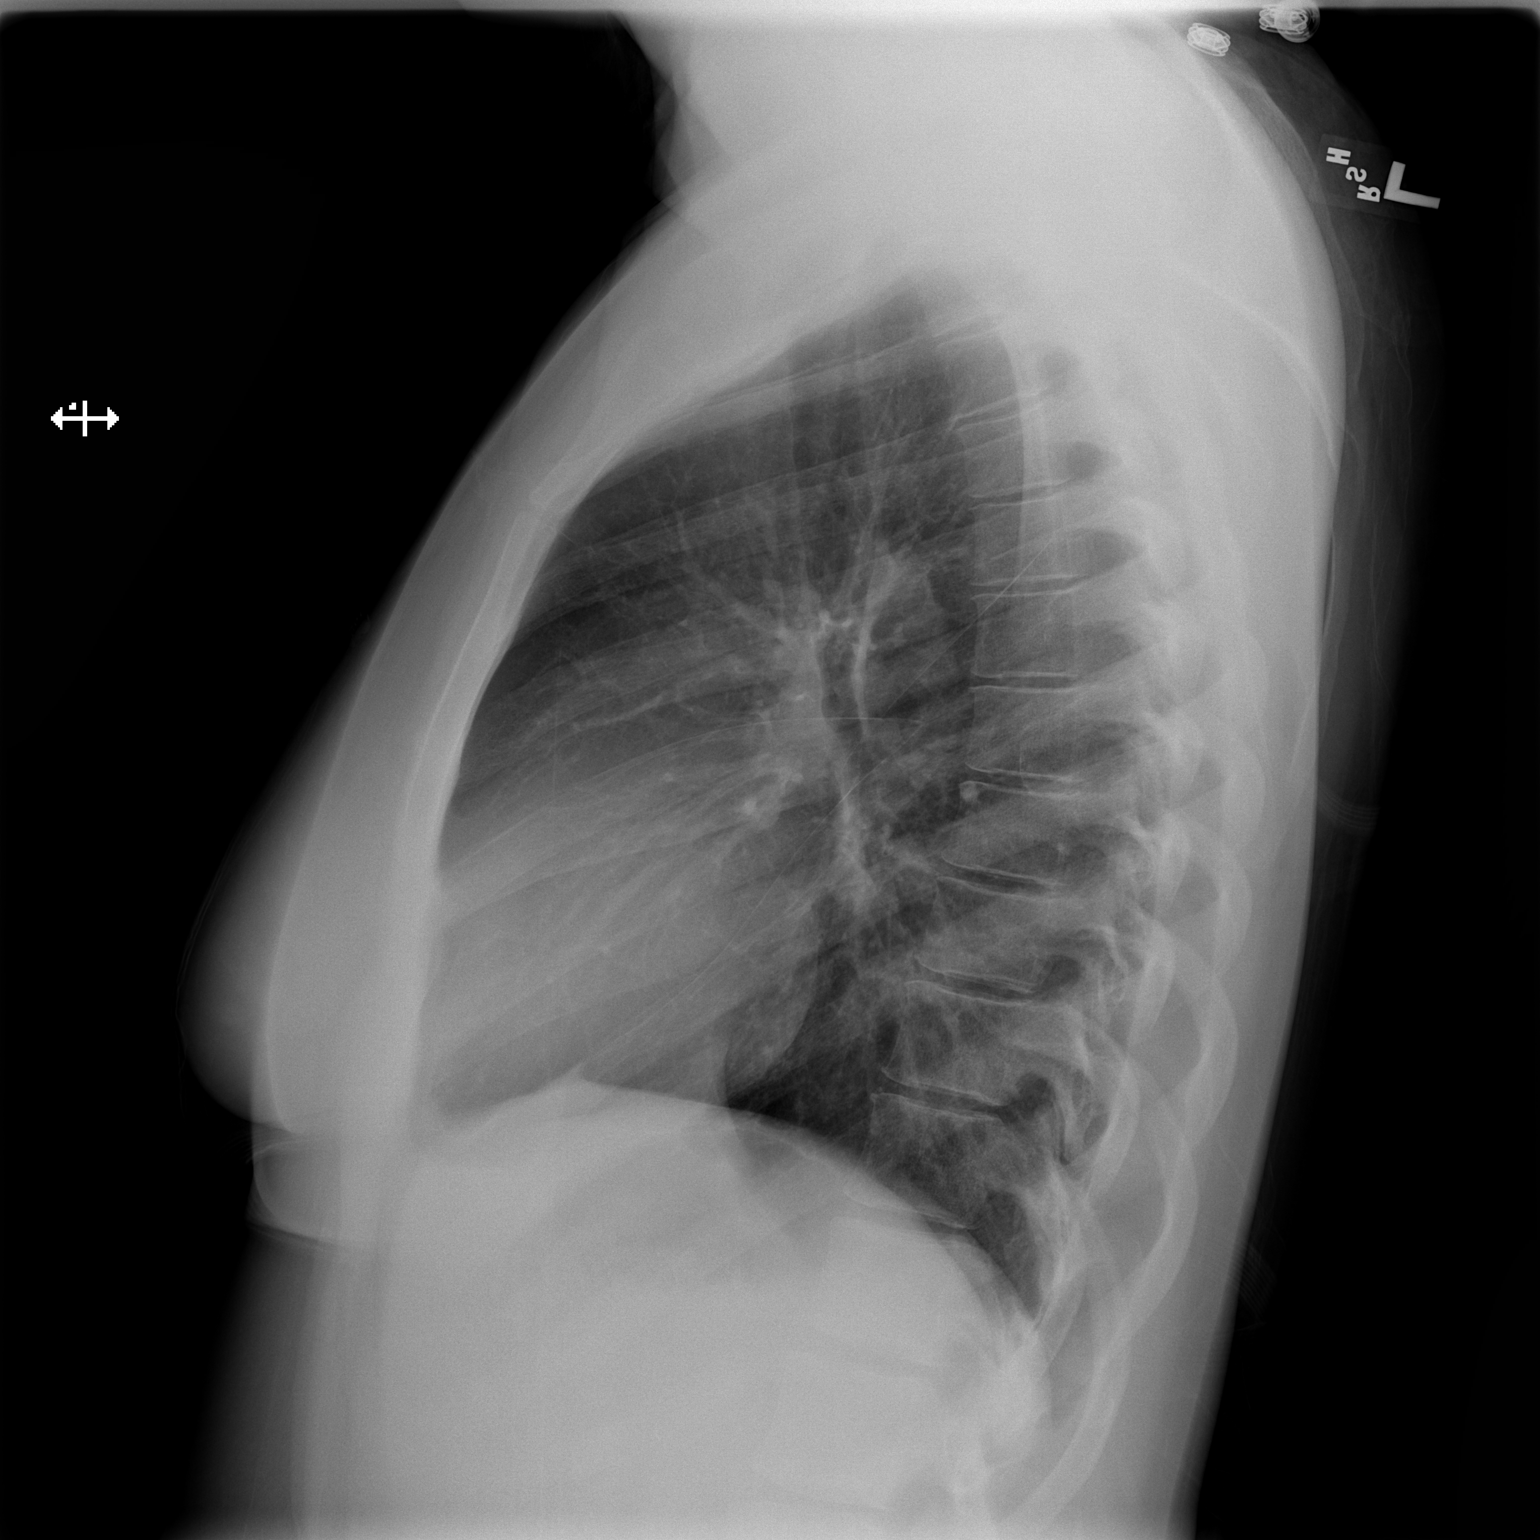

[2 of 2 positions shown; findings below may reference images not displayed]

FINDINGS: Normal cardiac and mediastinal contours. No consolidative pulmonary
opacities. No effusion or pneumothorax. Regional skeleton is
unremarkable.
IMPRESSION: No acute cardiopulmonary process.

## 2015-11-24 ENCOUNTER — Encounter (HOSPITAL_COMMUNITY): Payer: Self-pay | Admitting: *Deleted

## 2015-11-24 ENCOUNTER — Emergency Department (HOSPITAL_COMMUNITY)
Admission: EM | Admit: 2015-11-24 | Discharge: 2015-11-25 | Disposition: A | Discharge status: Home / Self Care | Payer: BLUE CROSS/BLUE SHIELD | Attending: Emergency Medicine | Admitting: Emergency Medicine

## 2015-11-24 ENCOUNTER — Emergency Department (HOSPITAL_COMMUNITY): Payer: BLUE CROSS/BLUE SHIELD

## 2015-11-24 DIAGNOSIS — I10 Essential (primary) hypertension: Secondary | ICD-10-CM | POA: Insufficient documentation

## 2015-11-24 DIAGNOSIS — Z87891 Personal history of nicotine dependence: Secondary | ICD-10-CM | POA: Diagnosis not present

## 2015-11-24 DIAGNOSIS — R0789 Other chest pain: Secondary | ICD-10-CM | POA: Insufficient documentation

## 2015-11-24 DIAGNOSIS — Z79899 Other long term (current) drug therapy: Secondary | ICD-10-CM | POA: Diagnosis not present

## 2015-11-24 DIAGNOSIS — R079 Chest pain, unspecified: Secondary | ICD-10-CM

## 2015-11-24 DIAGNOSIS — J45909 Unspecified asthma, uncomplicated: Secondary | ICD-10-CM | POA: Diagnosis not present

## 2015-11-24 DIAGNOSIS — Z791 Long term (current) use of non-steroidal anti-inflammatories (NSAID): Secondary | ICD-10-CM | POA: Insufficient documentation

## 2015-11-24 LAB — CBC
HEMATOCRIT: 36.6 % (ref 36.0–46.0)
HEMOGLOBIN: 12 g/dL (ref 12.0–15.0)
MCH: 28.6 pg (ref 26.0–34.0)
MCHC: 32.8 g/dL (ref 30.0–36.0)
MCV: 87.1 fL (ref 78.0–100.0)
Platelets: 242 10*3/uL (ref 150–400)
RBC: 4.2 MIL/uL (ref 3.87–5.11)
RDW: 14.7 % (ref 11.5–15.5)
WBC: 6.4 10*3/uL (ref 4.0–10.5)

## 2015-11-24 LAB — BASIC METABOLIC PANEL
ANION GAP: 6 (ref 5–15)
BUN: 10 mg/dL (ref 6–20)
CO2: 28 mmol/L (ref 22–32)
Calcium: 9.3 mg/dL (ref 8.9–10.3)
Chloride: 105 mmol/L (ref 101–111)
Creatinine, Ser: 0.84 mg/dL (ref 0.44–1.00)
GFR calc Af Amer: >60 mL/min (ref >60)
Glucose, Bld: 110 mg/dL — ABNORMAL HIGH (ref 65–99)
POTASSIUM: 3.5 mmol/L (ref 3.5–5.1)
SODIUM: 139 mmol/L (ref 135–145)

## 2015-11-24 LAB — I-STAT TROPONIN, ED: Troponin i, poc: 0 ng/mL (ref 0.00–0.08)

## 2015-11-24 MED ORDER — KETOROLAC TROMETHAMINE 30 MG/ML IJ SOLN
30.0000 mg | Freq: Once | INTRAMUSCULAR | Status: AC
  Administered 2015-11-25: 30 mg via INTRAVENOUS
  Filled 2015-11-24: qty 1

## 2015-11-24 NOTE — ED Notes (Signed)
Pt states that for the last few days she has had generalized chest pain; pt states "I feels like my lungs hurt"; pt denies cough, congestion or SHOB; pt states that she feels tired and generalized weakness; pt denies N/V; pt states that she has that same pain to the back and states "It still feels like my lungs" worse with deep breath

## 2015-11-25 LAB — D-DIMER, QUANTITATIVE (NOT AT ARMC)

## 2015-11-25 MED ORDER — DIAZEPAM 5 MG PO TABS: 5.0000 mg | ORAL_TABLET | Freq: Two times a day (BID) | ORAL | Status: DC

## 2015-11-25 MED ORDER — NAPROXEN 500 MG PO TABS: 500.0000 mg | ORAL_TABLET | Freq: Two times a day (BID) | ORAL | Status: DC

## 2015-11-25 MED ORDER — ALBUTEROL SULFATE HFA 108 (90 BASE) MCG/ACT IN AERS
2.0000 | INHALATION_SPRAY | Freq: Once | RESPIRATORY_TRACT | Status: AC
  Administered 2015-11-25: 2 via RESPIRATORY_TRACT
  Filled 2015-11-25 (×2): qty 6.7

## 2015-11-25 MED ORDER — ALBUTEROL SULFATE HFA 108 (90 BASE) MCG/ACT IN AERS: 1.0000 | INHALATION_SPRAY | Freq: Four times a day (QID) | RESPIRATORY_TRACT | Status: DC | PRN

## 2015-11-25 NOTE — ED Provider Notes (Signed)
CSN: [id number]     Arrival date & time 11/24/15  2203 History   First MD Initiated Contact with Patient 11/24/15 2314     Chief Complaint  Patient presents with  . Chest Pain   HPI   Abigail Mora is a 30 y.o. female PMH significant for HTN, asthma, polycystic kidney disease presenting with a 3 day history of chest pain. She describes her chest pain as diffuse, radiating to her back, 5/10 pain scale, discomfort, constant, nonexertional, pleuritic. She does not use an inhaler at home for her asthma. She denies fevers, chills, cough, congestion, SOB, N/V, changes in bowel/bladder habits, recent surgeries, leg swelling/discolorations/pain, periods of immobility.   Past Medical History  Diagnosis Date  . Hypertension   . Asthma   . Polycystic kidney disease   . Polycystic kidney disease    History reviewed. No pertinent past surgical history. No family history on file. Social History  Substance Use Topics  . Smoking status: Former Smoker -- 0.00 packs/day  . Smokeless tobacco: None  . Alcohol Use: Yes     Comment: occasionally   OB History    No data available     Review of Systems  Ten systems are reviewed and are negative for acute change except as noted in the HPI  Allergies  Review of patient's allergies indicates no known allergies.  Home Medications   Prior to Admission medications   Medication Sig Start Date End Date Taking? Authorizing Provider  ibuprofen (ADVIL,MOTRIN) 200 MG tablet Take 800 mg by mouth every 6 (six) hours as needed (pain).   Yes Historical Provider, MD  methocarbamol (ROBAXIN) 750 MG tablet Take 1 tablet (750 mg total) by mouth every 6 (six) hours as needed for muscle spasms (or pain). Patient not taking: Reported on 11/24/2015 05/14/14   Trixie Dredge, PA-C   BP 153/94 mmHg  Pulse 52  Temp(Src) 97.8 F (36.6 C) (Oral)  Resp 13  Wt 86.183 kg  SpO2 100%  LMP 11/12/2015 Physical Exam  Constitutional: She appears well-developed and  well-nourished. No distress.  HENT:  Head: Normocephalic and atraumatic.  Mouth/Throat: Oropharynx is clear and moist. No oropharyngeal exudate.  Eyes: Conjunctivae are normal. Pupils are equal, round, and reactive to light. Right eye exhibits no discharge. Left eye exhibits no discharge. No scleral icterus.  Neck: No tracheal deviation present.  Cardiovascular: Normal rate, regular rhythm, normal heart sounds and intact distal pulses.  Exam reveals no gallop and no friction rub.   No murmur heard. Pulmonary/Chest: Effort normal. No respiratory distress. She has wheezes. She has no rales. She exhibits tenderness.  Diffuse chest tenderness. Minimal end expiratory wheezes.  Abdominal: Soft. Bowel sounds are normal. She exhibits no distension and no mass. There is no tenderness. There is no rebound and no guarding.  Musculoskeletal: She exhibits no edema.  Lymphadenopathy:    She has no cervical adenopathy.  Neurological: She is alert. Coordination normal.  Skin: Skin is warm and dry. No rash noted. She is not diaphoretic. No erythema.  Psychiatric: She has a normal mood and affect. Her behavior is normal.  Nursing note and vitals reviewed.   ED Course  Procedures  Labs Review Labs Reviewed  BASIC METABOLIC PANEL - Abnormal; Notable for the following:    Glucose, Bld 110 (*)    All other components within normal limits  CBC  D-DIMER, QUANTITATIVE (NOT AT [facility])  Rosezena Sensor, ED    Imaging Review Dg Chest 2 View  11/24/2015  CLINICAL  DATA:  Acute onset of mid chest pain, radiating to the back. Shortness of breath. Initial encounter. EXAM: CHEST  2 VIEW COMPARISON:  Chest radiograph performed 05/13/2013 FINDINGS: The lungs are well-aerated and clear. There is no evidence of focal opacification, pleural effusion or pneumothorax. The heart is normal in size; the mediastinal contour is within normal limits. No acute osseous abnormalities are seen. IMPRESSION: No acute cardiopulmonary  process seen. Electronically Signed   By: Roanna RaiderJeffery  Chang M.D.   On: 11/24/2015 22:42   I have personally reviewed and evaluated these images and lab results as part of my medical decision-making.   EKG Interpretation   Date/Time:  Tuesday November 24 2015 22:12:04 EDT Ventricular Rate:  70 PR Interval:  177 QRS Duration: 94 QT Interval:  442 QTC Calculation: 477 R Axis:   52 Text Interpretation:  Sinus rhythm RSR' in V1 or V2, right VCD or RVH  Borderline prolonged QT interval Baseline wander in lead(s) V5 T wave  amplitude has decreased in all leads Otherwise no significant change  Confirmed by FLOYD MD, DANIEL ([id number]) on 11/25/2015 7:16:22 PM      MDM   Final diagnoses:  Chest pain, unspecified chest pain type   Patient nontoxic appearing, VSS.  Patient's pain improved with albuterol and toradol.  Patient is to be discharged with recommendation to follow up with PCP in regards to today's hospital visit. Chest pain is not likely of cardiac or pulmonary etiology d/t presentation, d-dimer negative, VSS, no tracheal deviation, no JVD or new murmur, RRR, breath sounds equal bilaterally, EKG without acute abnormalities, negative troponin, and negative CXR. Pt has been advised to return to the ED if CP becomes exertional, associated with diaphoresis or nausea, radiates to left jaw/arm, worsens or becomes concerning in any way. Pt appears reliable for follow up and is agreeable to discharge.   Case has been discussed with Dr. Lynelle DoctorKnapp who agrees with the above plan to discharge.    Melton KrebsSamantha Nicole Esli Jernigan, PA-C 12/04/15 1022  Abigail AlbeIva Knapp, MD 12/07/15 [phone]

## 2015-11-25 NOTE — Discharge Instructions (Signed)
Ms. Donald PoreSade L Olshefski,  Nice meeting you! Please follow-up with your primary care provider. Return to the emergency department if you develop increasing pain, shortness of breath, new/worsening symptoms. Your breast pain should be followed up. Feel better soon!  S. Lane HackerNicole Payne Garske, PA-C

## 2016-08-04 ENCOUNTER — Encounter (HOSPITAL_COMMUNITY): Payer: Self-pay | Admitting: Emergency Medicine

## 2016-08-04 ENCOUNTER — Ambulatory Visit (HOSPITAL_COMMUNITY)
Admission: EM | Admit: 2016-08-04 | Discharge: 2016-08-04 | Disposition: A | Discharge status: Home / Self Care | Payer: BLUE CROSS/BLUE SHIELD | Attending: Emergency Medicine | Admitting: Emergency Medicine

## 2016-08-04 DIAGNOSIS — G44219 Episodic tension-type headache, not intractable: Secondary | ICD-10-CM | POA: Diagnosis not present

## 2016-08-04 DIAGNOSIS — I10 Essential (primary) hypertension: Secondary | ICD-10-CM

## 2016-08-04 MED ORDER — DEXAMETHASONE SODIUM PHOSPHATE 10 MG/ML IJ SOLN
INTRAMUSCULAR | Status: AC
  Filled 2016-08-04: qty 1

## 2016-08-04 MED ORDER — ACETAMINOPHEN 325 MG PO TABS
ORAL_TABLET | ORAL | Status: AC
  Filled 2016-08-04: qty 2

## 2016-08-04 MED ORDER — ACETAMINOPHEN 325 MG PO TABS
650.0000 mg | ORAL_TABLET | Freq: Once | ORAL | Status: AC
  Administered 2016-08-04: 650 mg via ORAL

## 2016-08-04 MED ORDER — DEXAMETHASONE SODIUM PHOSPHATE 10 MG/ML IJ SOLN
10.0000 mg | Freq: Once | INTRAMUSCULAR | Status: AC
  Administered 2016-08-04: 10 mg via INTRAMUSCULAR

## 2016-08-04 NOTE — ED Provider Notes (Signed)
CSN: [id number]     Arrival date & time 08/04/16  1107 History   First MD Initiated Contact with Patient 08/04/16 1156     Chief Complaint  Patient presents with  . Headache  . Hypertension    160/110  160/102   (Consider location/radiation/quality/duration/timing/severity/associated sxs/prior Treatment) 31 year old female with a known history of a chronic daily headaches, primarily bitemporal awoke with a headache this morning about 8:15. She was at work and headache has persisted through the early afternoon. This is only different from her previous headaches in that it is lasted longer and did not respond to a total of 1200 mg of ibuprofen taken between 8 AM and noon. She notes that her headaches are worse sometimes at work and associated with stress.  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 In the urgent care is 167/94 with a pulse of 63.  Denies problems with vision, speech, hearing, swallowing, photophobia, nausea or vomiting or abdominal pain, focal paresthesias or weakness, dizziness, problems with gait, confusion, orientation or memory.      Past Medical History:  Diagnosis Date  . Asthma   . Hypertension   . Polycystic kidney disease   . Polycystic kidney disease    History reviewed. No pertinent surgical history. History reviewed. No pertinent family history. Social History  Substance Use Topics  . Smoking status: Former Smoker    Packs/day: 0.00  . Smokeless tobacco: Never Used  . Alcohol use Yes     Comment: occasionally   OB History    No data available     Review of Systems  Constitutional: Negative.   HENT: Negative.   Eyes: Negative.   Respiratory: Negative.   Neurological: Positive for headaches. Negative for dizziness, tremors, seizures, syncope, facial asymmetry, speech difficulty, weakness, light-headedness and numbness.  Psychiatric/Behavioral: Negative.   All other systems reviewed and are  negative.   Allergies  Patient has no known allergies.  Home Medications   Prior to Admission medications   Medication Sig Start Date End Date Taking? Authorizing Provider  albuterol (PROVENTIL HFA;VENTOLIN HFA) 108 (90 Base) MCG/ACT inhaler Inhale 1-2 puffs into the lungs every 6 (six) hours as needed for wheezing or shortness of breath. 11/25/15  Yes Melton KrebsSamantha Nicole Riley, PA-C  ibuprofen (ADVIL,MOTRIN) 200 MG tablet Take 800 mg by mouth every 6 (six) hours as needed (pain).   Yes Historical Provider, MD  diazepam (VALIUM) 5 MG tablet Take 1 tablet (5 mg total) by mouth 2 (two) times daily. 11/25/15   Melton KrebsSamantha Nicole Riley, PA-C   Meds Ordered and Administered this Visit   Medications  dexamethasone (DECADRON) injection 10 mg (not administered)  acetaminophen (TYLENOL) tablet 650 mg (not administered)    BP 167/94 (BP Location: Left Arm)   Pulse 63   Temp 98.6 F (37 C) (Oral)   LMP 07/16/2016 (Exact Date)   SpO2 100%  No data found.   Physical Exam  Constitutional: She is oriented to person, place, and time. She appears well-developed and well-nourished. No distress.  HENT:  Head: Normocephalic and atraumatic.  Right Ear: External ear normal.  Left Ear: External ear normal.  Mouth/Throat: Oropharynx is clear and moist. No oropharyngeal exudate.  Eyes: Conjunctivae and EOM are normal. Pupils are equal, round, and reactive to light. Right eye exhibits no discharge. Left eye exhibits no discharge.  Neck: Normal range of motion. Neck supple.  Cardiovascular: Normal rate, regular rhythm, normal heart sounds and intact  distal pulses.   Pulmonary/Chest: Effort normal and breath sounds normal. No respiratory distress.  Abdominal: Soft. There is no tenderness.  Musculoskeletal: Normal range of motion. She exhibits no edema or tenderness.  Lymphadenopathy:    She has no cervical adenopathy.  Neurological: She is alert and oriented to person, place, and time. She has normal  strength. She displays no tremor. No cranial nerve deficit or sensory deficit. She exhibits normal muscle tone. Coordination and gait normal. GCS eye subscore is 4. GCS verbal subscore is 5. GCS motor subscore is 6.  Skin: Skin is warm and dry. No rash noted.  Psychiatric: She has a normal mood and affect. Her speech is normal and behavior is normal. Judgment and thought content normal. Cognition and memory are normal.  Nursing note and vitals reviewed.   Urgent Care Course   Clinical Course     Procedures (including critical care time)  Labs Review Labs Reviewed - No data to display  Imaging Review No results found.   Visual Acuity Review  Right Eye Distance:   Left Eye Distance:   Bilateral Distance:    Right Eye Near:   Left Eye Near:    Bilateral Near:         MDM   1. Hypertension, unspecified type   2. Episodic tension-type headache, not intractable    It is important to obtain a primary care provider and an appointment as soon as possible. You will need some blood work, history and physical. Your headache is similar to that of tension headache and headaches caused by stress. You have had elevated blood pressure for a long. You will need to have that treated as well using medication determined to be best after your evaluation and blood work is performed. At this time your blood pressure is elevated but not in the emergency. If you have had headaches for a long time and take a medicine such as Advil or other NSAIDs this can cause a rebound type of headache. Read your instructions regarding this type of headache. Meds ordered this encounter  Medications  . dexamethasone (DECADRON) injection 10 mg  . acetaminophen (TYLENOL) tablet 650 mg       Hayden Rasmussen, NP 08/04/16 1235

## 2016-08-04 NOTE — ED Triage Notes (Signed)
Pt woke up with a headache this morning.  She reports frequent headaches.  She had her blood pressure taken twice at work this morning.  At 0800 it was 160/110 and on repeat at 1020 it was 160/102.  Pt's job sent her here to be checked out.

## 2016-08-04 NOTE — Discharge Instructions (Signed)
It is important to obtain a primary care provider and an appointment as soon as possible. You will need some blood work, history and physical. Your headache is similar to that of tension headache and headaches caused by stress. You have had elevated blood pressure for a long. You will need to have that treated as well using medication determined to be best after your evaluation and blood work is performed. At this time your blood pressure is elevated but not in the emergency. If you have had headaches for a long time and take a medicine such as Advil or other NSAIDs this can cause a rebound type of headache. Read your instructions regarding this type of headache.

## 2017-12-28 ENCOUNTER — Emergency Department (HOSPITAL_COMMUNITY)
Admission: EM | Admit: 2017-12-28 | Discharge: 2017-12-28 | Disposition: A | Discharge status: Home / Self Care | Payer: 59 | Attending: Emergency Medicine | Admitting: Emergency Medicine

## 2017-12-28 ENCOUNTER — Other Ambulatory Visit: Payer: Self-pay

## 2017-12-28 DIAGNOSIS — R55 Syncope and collapse: Secondary | ICD-10-CM | POA: Insufficient documentation

## 2017-12-28 DIAGNOSIS — J45909 Unspecified asthma, uncomplicated: Secondary | ICD-10-CM | POA: Insufficient documentation

## 2017-12-28 DIAGNOSIS — Z79899 Other long term (current) drug therapy: Secondary | ICD-10-CM | POA: Diagnosis not present

## 2017-12-28 DIAGNOSIS — Z87891 Personal history of nicotine dependence: Secondary | ICD-10-CM | POA: Diagnosis not present

## 2017-12-28 DIAGNOSIS — I10 Essential (primary) hypertension: Secondary | ICD-10-CM | POA: Diagnosis not present

## 2017-12-28 LAB — I-STAT BETA HCG BLOOD, ED (MC, WL, AP ONLY): I-stat hCG, quantitative: <5 m[IU]/mL (ref <5)

## 2017-12-28 LAB — BASIC METABOLIC PANEL
ANION GAP: 7 (ref 5–15)
BUN: 9 mg/dL (ref 6–20)
CHLORIDE: 106 mmol/L (ref 101–111)
CO2: 26 mmol/L (ref 22–32)
CREATININE: 0.82 mg/dL (ref 0.44–1.00)
Calcium: 9.3 mg/dL (ref 8.9–10.3)
GFR calc non Af Amer: >60 mL/min (ref >60)
GLUCOSE: 102 mg/dL — AB (ref 65–99)
Potassium: 4.1 mmol/L (ref 3.5–5.1)
Sodium: 139 mmol/L (ref 135–145)

## 2017-12-28 LAB — CBC
HCT: 36.8 % (ref 36.0–46.0)
HEMOGLOBIN: 11.9 g/dL — AB (ref 12.0–15.0)
MCH: 28.7 pg (ref 26.0–34.0)
MCHC: 32.3 g/dL (ref 30.0–36.0)
MCV: 88.9 fL (ref 78.0–100.0)
Platelets: 239 10*3/uL (ref 150–400)
RBC: 4.14 MIL/uL (ref 3.87–5.11)
RDW: 14.3 % (ref 11.5–15.5)
WBC: 6.6 10*3/uL (ref 4.0–10.5)

## 2017-12-28 LAB — CBG MONITORING, ED: Glucose-Capillary: 76 mg/dL (ref 65–99)

## 2017-12-28 MED ORDER — SODIUM CHLORIDE 0.9 % IV BOLUS
1000.0000 mL | Freq: Once | INTRAVENOUS | Status: AC
  Administered 2017-12-28: 1000 mL via INTRAVENOUS

## 2017-12-28 NOTE — ED Triage Notes (Signed)
Patient states that she was working upstairs, when she began to feel lightheaded like she wanted to pass out.

## 2017-12-28 NOTE — ED Notes (Signed)
Pt states she is unable to provide a urine sample because she just went to the restroom. Will try again later.

## 2017-12-28 NOTE — ED Notes (Signed)
ED Provider at bedside. 

## 2017-12-28 NOTE — ED Provider Notes (Signed)
[facility] EMERGENCY DEPARTMENT Provider Note   CSN: [id number] Arrival date & time: 12/28/17  0608     History   Chief Complaint Chief Complaint  Patient presents with  . Near Syncope    HPI Abigail Mora is a 32 y.o. female.  HPI   32 year old female with history of hypertension, asthma presenting for evaluation of near syncope.  Patient is a staff walking upstairs.  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  She spent the entire day moving about and unable to eat much.  States she only had 2 cookies foot dinner at 11 PM.  Felt overworked, and overwhelmed and towards the end of the shift, she felt lightheadedness and as she sat down and her symptoms was intensified prompting her to come here for further evaluation.  Now resting, patient felt better.  She denies any associated pain.  No headache, neck pain, chest pain, trouble breathing, abdominal pain, back pain, focal numbness or weakness.  She is currently on her menstruation, on her third day.  History of asthma but denies any shortness of breath.  Denies any history of exertional syncope and no family history of premature cardiac death.    Past Medical History:  Diagnosis Date  . Asthma   . Hypertension   . Polycystic kidney disease   . Polycystic kidney disease     There are no active problems to display for this patient.   No past surgical history on file.   OB History   None      Home Medications    Prior to Admission medications   Medication Sig Start Date End Date Taking? Authorizing Provider  albuterol (PROVENTIL HFA;VENTOLIN HFA) 108 (90 Base) MCG/ACT inhaler Inhale 1-2 puffs into the lungs every 6 (six) hours as needed for wheezing or shortness of breath. 11/25/15   Melton Krebs, PA-C  diazepam (VALIUM) 5 MG tablet Take 1 tablet (5 mg total) by mouth 2 (two) times daily. 11/25/15   Melton Krebs, PA-C  ibuprofen  (ADVIL,MOTRIN) 200 MG tablet Take 800 mg by mouth every 6 (six) hours as needed (pain).    [provider]    Family History No family history on file.  Social History Social History   Tobacco Use  . Smoking status: Former Smoker    Packs/day: 0.00  . Smokeless tobacco: Never Used  Substance Use Topics  . Alcohol use: Yes    Comment: occasionally  . Drug use: Yes    Types: Marijuana     Allergies   Patient has no known allergies.   Review of Systems Review of Systems  All other systems reviewed and are negative.    Physical Exam Updated Vital Signs BP (!) 153/95   Pulse 68   Temp 98.3 F (36.8 C) (Oral)   Resp 16   Ht 6' (1.829 m)   Wt 90.7 kg (200 lb)   LMP 12/25/2017 (Exact Date)   SpO2 100%   BMI 27.12 kg/m   Physical Exam  Constitutional: She is oriented to person, place, and time. She appears well-developed and well-nourished. No distress.  HENT:  Head: Atraumatic.  Mouth/Throat: Oropharynx is clear and moist.  Eyes: Conjunctivae and EOM are normal.  Neck: Normal range of motion. Neck supple. No JVD present. No thyromegaly present.  Cardiovascular: Normal rate and regular rhythm.  Pulmonary/Chest: Effort normal and breath sounds normal. No stridor. She has no wheezes.  She has no rales.  Abdominal: Soft. She exhibits no distension. There is no tenderness.  Neurological: She is alert and oriented to person, place, and time. She has normal strength. No cranial nerve deficit or sensory deficit. She displays a negative Romberg sign. Coordination and gait normal. GCS eye subscore is 4. GCS verbal subscore is 5. GCS motor subscore is 6.  Skin: Skin is warm. No rash noted.  Psychiatric: She has a normal mood and affect.  Nursing note and vitals reviewed.    ED Treatments / Results  Labs (all labs ordered are listed, but only abnormal results are displayed) Labs Reviewed  BASIC METABOLIC PANEL - Abnormal; Notable for the following components:       Result Value   Glucose, Bld 102 (*)    All other components within normal limits  CBC - Abnormal; Notable for the following components:   Hemoglobin 11.9 (*)    All other components within normal limits  URINALYSIS, ROUTINE W REFLEX MICROSCOPIC  CBG MONITORING, ED  I-STAT BETA HCG BLOOD, ED ([facility], [facility], [facility] ONLY)    EKG None   Date: 12/28/2017  Rate: 68  Rhythm: normal sinus rhythm  QRS Axis: normal  Intervals: normal  ST/T Wave abnormalities: normal  Conduction Disutrbances: none  Narrative Interpretation:   Old EKG Reviewed: No significant changes noted      Radiology No results found.  Procedures Procedures (including critical care time)  Medications Ordered in ED Medications  sodium chloride 0.9 % bolus 1,000 mL (1,000 mLs Intravenous New Bag/Given 12/28/17 0706)     Initial Impression / Assessment and Plan / ED Course  I have reviewed the triage vital signs and the nursing notes.  Pertinent labs & imaging results that were available during my care of the patient were reviewed by me and considered in my medical decision making (see chart for details).     BP (!) 142/98   Pulse 67   Temp 98.3 F (36.8 C) (Oral)   Resp 18   Ht 6' (1.829 m)   Wt 90.7 kg (200 lb)   LMP 12/25/2017 (Exact Date)   SpO2 100%   BMI 27.12 kg/m    Final Clinical Impressions(s) / ED Diagnoses   Final diagnoses:  Near syncope    ED Discharge Orders    None     7:11 AM Patient patient report near syncope after a long shift walking upstairs.  No room spinning sensation.  No pain.  At this time patient felt better.  Vital signs stable.  Mildly hypertensive with blood pressure 161/114.  She is afebrile.  Work-up initiated, IV fluid given.  8:02 AM Normal orthostatic vital sign, pregnancy test is negative, electro lites panels are reassuring, no evidence of anemia, EKG without concerning ischemic changes or abnormal arrhythmia, patient felt comfortable going home.  She is made  aware that her blood pressure is elevated and need to have it rechecked.  Return precautions discussed.   Fayrene Helper, PA-C 12/28/17 8469    Glynn Octave, MD 12/28/17 [phone]

## 2019-03-07 ENCOUNTER — Ambulatory Visit (HOSPITAL_COMMUNITY)
Admission: EM | Admit: 2019-03-07 | Discharge: 2019-03-07 | Disposition: A | Discharge status: Home / Self Care | Payer: 59 | Attending: Family Medicine | Admitting: Family Medicine

## 2019-03-07 ENCOUNTER — Other Ambulatory Visit: Payer: Self-pay

## 2019-03-07 ENCOUNTER — Ambulatory Visit (INDEPENDENT_AMBULATORY_CARE_PROVIDER_SITE_OTHER): Payer: 59

## 2019-03-07 ENCOUNTER — Encounter (HOSPITAL_COMMUNITY): Payer: Self-pay

## 2019-03-07 DIAGNOSIS — R05 Cough: Secondary | ICD-10-CM | POA: Diagnosis not present

## 2019-03-07 DIAGNOSIS — J4521 Mild intermittent asthma with (acute) exacerbation: Secondary | ICD-10-CM

## 2019-03-07 MED ORDER — PREDNISONE 10 MG PO TABS: 40.0000 mg | ORAL_TABLET | Freq: Every day | ORAL | 0 refills | Status: AC

## 2019-03-07 MED ORDER — BENZONATATE 100 MG PO CAPS: 100.0000 mg | ORAL_CAPSULE | Freq: Three times a day (TID) | ORAL | 0 refills | Status: AC | PRN

## 2019-03-07 MED ORDER — ALBUTEROL SULFATE HFA 108 (90 BASE) MCG/ACT IN AERS: 1.0000 | INHALATION_SPRAY | Freq: Four times a day (QID) | RESPIRATORY_TRACT | 1 refills | Status: DC | PRN

## 2019-03-07 MED ORDER — ALBUTEROL SULFATE HFA 108 (90 BASE) MCG/ACT IN AERS: 1.0000 | INHALATION_SPRAY | Freq: Four times a day (QID) | RESPIRATORY_TRACT | 1 refills | Status: AC | PRN

## 2019-03-07 NOTE — ED Triage Notes (Signed)
Pt states she has a cough that's been hanging  around for 4 days. Pt states this cough is worst at night. Pt has been tested for Covid already. Pt is negative.

## 2019-03-07 NOTE — ED Provider Notes (Signed)
[facility]    CSN: [id number] Arrival date & time: 03/07/19  0808     History   Chief Complaint Chief Complaint  Patient presents with  . Cough    HPI Abigail Mora is a 33 y.o. female.   Patient is a 33 year old female with past medical history of asthma, hypertension, polycystic kidney disease.  She presents today for cough.  This is been present, constant over the past 4 days.  She was tested for COVID and receive negative results.  The cough is been somewhat productive.  She has had some shortness of breath at nighttime.  She is also her wheezing at night.  Does not use an inhaler and her asthma appears to be well controlled.  She has been using NyQuil without much relief.  Denies any history of allergies.  Patient works in the hospital.  Denies any chest pain, rhinorrhea, sore throat or ear pain.  ROS per HPI      Past Medical History:  Diagnosis Date  . Asthma   . Hypertension   . Polycystic kidney disease   . Polycystic kidney disease     There are no active problems to display for this patient.   History reviewed. No pertinent surgical history.  OB History   No obstetric history on file.      Home Medications    Prior to Admission medications   Medication Sig Start Date End Date Taking? Authorizing Provider  albuterol (VENTOLIN HFA) 108 (90 Base) MCG/ACT inhaler Inhale 1-2 puffs into the lungs every 6 (six) hours as needed for wheezing or shortness of breath. 03/07/19   Dahlia ByesBast, Chestine Belknap A, NP  benzonatate (TESSALON) 100 MG capsule Take 1 capsule (100 mg total) by mouth 3 (three) times daily as needed for cough. Take 1 to 2 capsules every 8 hours as needed 03/07/19   Dahlia ByesBast, Jerri Glauser A, NP  predniSONE (DELTASONE) 10 MG tablet Take 4 tablets (40 mg total) by mouth daily for 5 days. 03/07/19 03/12/19  Janace ArisBast, [place] Winslow A, NP    Family History History reviewed. No pertinent family history.  Social History Social History   Tobacco Use  . Smoking status: Former  Smoker    Packs/day: 0.00  . Smokeless tobacco: Never Used  Substance Use Topics  . Alcohol use: Yes    Comment: occasionally  . Drug use: Yes    Types: Marijuana     Allergies   Patient has no known allergies.   Review of Systems Review of Systems   Physical Exam Triage Vital Signs ED Triage Vitals  Enc Vitals Group     BP 03/07/19 0827 (!) 167/110     Pulse Rate 03/07/19 0827 82     Resp 03/07/19 0827 18     Temp 03/07/19 0827 99 F (37.2 C)     Temp src --      SpO2 03/07/19 0827 100 %     Weight 03/07/19 0825 210 lb (95.3 kg)     Height --      Head Circumference --      Peak Flow --      Pain Score 03/07/19 0825 4     Pain Loc --      Pain Edu? --      Excl. in GC? --    No data found.  Updated Vital Signs BP (!) 167/110 (BP Location: Right Arm)   Pulse 82   Temp 99 F (37.2 C)   Resp 18  Wt 210 lb (95.3 kg)   LMP 02/08/2019   SpO2 100%   BMI 28.48 kg/m   Visual Acuity Right Eye Distance:   Left Eye Distance:   Bilateral Distance:    Right Eye Near:   Left Eye Near:    Bilateral Near:     Physical Exam Vitals signs and nursing note reviewed.  Constitutional:      General: She is not in acute distress.    Appearance: Normal appearance. She is not ill-appearing or diaphoretic.  HENT:     Head: Normocephalic and atraumatic.     Right Ear: Tympanic membrane and ear canal normal.     Left Ear: Tympanic membrane and ear canal normal.     Nose: Nose normal.     Mouth/Throat:     Comments: PND  Eyes:     Conjunctiva/sclera: Conjunctivae normal.  Neck:     Musculoskeletal: Normal range of motion.  Cardiovascular:     Rate and Rhythm: Normal rate and regular rhythm.     Pulses: Normal pulses.     Heart sounds: Normal heart sounds.  Pulmonary:     Effort: Pulmonary effort is normal.     Breath sounds: Wheezing and rhonchi present.  Musculoskeletal: Normal range of motion.  Skin:    General: Skin is warm and dry.  Neurological:      Mental Status: She is alert.  Psychiatric:        Mood and Affect: Mood normal.      UC Treatments / Results  Labs (all labs ordered are listed, but only abnormal results are displayed) Labs Reviewed - No data to display  EKG   Radiology Dg Chest 2 View  Result Date: 03/07/2019 CLINICAL DATA:  Cough and wheezing EXAM: CHEST - 2 VIEW COMPARISON:  November 24, 2015 FINDINGS: Lungs are clear. Heart size and pulmonary vascularity are normal. No adenopathy. No pneumothorax. No bone lesions. IMPRESSION: No edema or consolidation. Electronically Signed   By: Lowella Grip III M.D.   On: 03/07/2019 08:58    Procedures Procedures (including critical care time)  Medications Ordered in UC Medications - No data to display  Initial Impression / Assessment and Plan / UC Course  I have reviewed the triage vital signs and the nursing notes.  Pertinent labs & imaging results that were available during my care of the patient were reviewed by me and considered in my medical decision making (see chart for details).    Mild intermittent asthma with exacerbation.  Patient is a 33 year old female with past medical history of asthma.  She presents today with mild shortness of breath, cough and wheezing. X-ray negative for any consolidation. Most likely asthma exacerbation.  Most likely due to allergies.  We will treat her with prednisone burst, albuterol inhaler and Tessalon Perles for cough. Recommended daily Zyrtec for allergy symptoms. Also recommended following up with a primary care provider due to pretty consistent elevated blood pressure. Unknown of family history of blood pressure. Recommended that she monitor her blood pressures daily. Follow up as needed for continued or worsening symptoms    Final Clinical Impressions(s) / UC Diagnoses   Final diagnoses:  Mild intermittent asthma with exacerbation     Discharge Instructions     Treating you for asthma exacerbation Take the  medication as prescribed.  Make sure you are checking your blood pressure regularly. Recommend follow-up with a primary care provider Follow up as needed for continued or worsening symptoms  ED Prescriptions    Medication Sig Dispense Auth. Provider   albuterol (VENTOLIN HFA) 108 (90 Base) MCG/ACT inhaler  (Status: Discontinued) Inhale 1-2 puffs into the lungs every 6 (six) hours as needed for wheezing or shortness of breath. 18 g Chistina Roston A, NP   predniSONE (DELTASONE) 10 MG tablet Take 4 tablets (40 mg total) by mouth daily for 5 days. 20 tablet Lyall Faciane A, NP   benzonatate (TESSALON) 100 MG capsule Take 1 capsule (100 mg total) by mouth 3 (three) times daily as needed for cough. Take 1 to 2 capsules every 8 hours as needed 21 capsule Akshaj Besancon A, NP   albuterol (VENTOLIN HFA) 108 (90 Base) MCG/ACT inhaler Inhale 1-2 puffs into the lungs every 6 (six) hours as needed for wheezing or shortness of breath. 18 g Dahlia ByesBast, Adaleen Hulgan A, NP     Controlled Substance Prescriptions [place] Controlled Substance Registry consulted? Not Applicable   Janace ArisBast, Tereso Unangst A, NP 03/08/19 [phone]

## 2019-03-07 NOTE — Discharge Instructions (Addendum)
Treating you for asthma exacerbation Take the medication as prescribed.  Make sure you are checking your blood pressure regularly. Recommend follow-up with a primary care provider Follow up as needed for continued or worsening symptoms

## 2019-11-20 DIAGNOSIS — H5213 Myopia, bilateral: Secondary | ICD-10-CM | POA: Diagnosis not present

## 2021-09-30 DIAGNOSIS — Q613 Polycystic kidney, unspecified: Secondary | ICD-10-CM | POA: Diagnosis not present

## 2021-09-30 DIAGNOSIS — Z0000 Encounter for general adult medical examination without abnormal findings: Secondary | ICD-10-CM | POA: Diagnosis not present

## 2021-09-30 DIAGNOSIS — Z23 Encounter for immunization: Secondary | ICD-10-CM | POA: Diagnosis not present

## 2021-09-30 DIAGNOSIS — Z13220 Encounter for screening for lipoid disorders: Secondary | ICD-10-CM | POA: Diagnosis not present

## 2021-09-30 DIAGNOSIS — I10 Essential (primary) hypertension: Secondary | ICD-10-CM | POA: Diagnosis not present

## 2021-10-14 DIAGNOSIS — D649 Anemia, unspecified: Secondary | ICD-10-CM | POA: Diagnosis not present

## 2021-11-03 ENCOUNTER — Other Ambulatory Visit (HOSPITAL_COMMUNITY): Payer: Self-pay

## 2021-11-03 MED ORDER — LISINOPRIL 20 MG PO TABS
20.0000 mg | ORAL_TABLET | Freq: Every day | ORAL | 1 refills | Status: AC
  Filled 2021-11-03: qty 30, 30d supply, fill #0
  Filled 2022-01-05: qty 30, 30d supply, fill #1

## 2022-01-05 ENCOUNTER — Other Ambulatory Visit (HOSPITAL_COMMUNITY): Payer: Self-pay

## 2022-10-10 DIAGNOSIS — I10 Essential (primary) hypertension: Secondary | ICD-10-CM | POA: Diagnosis not present

## 2022-10-10 DIAGNOSIS — Z0000 Encounter for general adult medical examination without abnormal findings: Secondary | ICD-10-CM | POA: Diagnosis not present

## 2022-10-10 DIAGNOSIS — Q613 Polycystic kidney, unspecified: Secondary | ICD-10-CM | POA: Diagnosis not present

## 2022-10-10 DIAGNOSIS — Z13220 Encounter for screening for lipoid disorders: Secondary | ICD-10-CM | POA: Diagnosis not present

## 2022-11-15 DIAGNOSIS — I10 Essential (primary) hypertension: Secondary | ICD-10-CM | POA: Diagnosis not present

## 2022-11-29 ENCOUNTER — Other Ambulatory Visit (HOSPITAL_COMMUNITY): Payer: Self-pay

## 2022-11-29 MED ORDER — CHLORTHALIDONE 25 MG PO TABS
25.0000 mg | ORAL_TABLET | Freq: Every morning | ORAL | 0 refills | Status: DC
  Filled 2022-11-29: qty 30, 30d supply, fill #0

## 2022-12-05 DIAGNOSIS — I10 Essential (primary) hypertension: Secondary | ICD-10-CM | POA: Diagnosis not present

## 2022-12-19 ENCOUNTER — Other Ambulatory Visit (HOSPITAL_COMMUNITY): Payer: Self-pay

## 2022-12-19 MED ORDER — METOPROLOL TARTRATE 50 MG PO TABS
50.0000 mg | ORAL_TABLET | Freq: Two times a day (BID) | ORAL | 2 refills | Status: AC
  Filled 2022-12-19: qty 60, 30d supply, fill #0

## 2023-01-02 ENCOUNTER — Other Ambulatory Visit (HOSPITAL_COMMUNITY): Payer: Self-pay

## 2023-01-02 MED ORDER — METOPROLOL TARTRATE 50 MG PO TABS
50.0000 mg | ORAL_TABLET | Freq: Two times a day (BID) | ORAL | 2 refills | Status: AC
  Filled 2023-01-02 – 2023-01-23 (×2): qty 60, 30d supply, fill #0
  Filled 2023-03-10: qty 60, 30d supply, fill #1

## 2023-01-02 MED ORDER — CHLORTHALIDONE 25 MG PO TABS
25.0000 mg | ORAL_TABLET | Freq: Every morning | ORAL | 5 refills | Status: AC
  Filled 2023-01-02: qty 30, 30d supply, fill #0
  Filled 2023-02-10: qty 90, 90d supply, fill #1

## 2023-01-23 ENCOUNTER — Other Ambulatory Visit (HOSPITAL_COMMUNITY): Payer: Self-pay

## 2023-02-10 ENCOUNTER — Other Ambulatory Visit (HOSPITAL_COMMUNITY): Payer: Self-pay

## 2023-03-10 ENCOUNTER — Other Ambulatory Visit (HOSPITAL_COMMUNITY): Payer: Self-pay

## 2023-03-24 ENCOUNTER — Other Ambulatory Visit (HOSPITAL_COMMUNITY): Payer: Self-pay

## 2023-03-24 DIAGNOSIS — J45901 Unspecified asthma with (acute) exacerbation: Secondary | ICD-10-CM | POA: Diagnosis not present

## 2023-03-24 DIAGNOSIS — J069 Acute upper respiratory infection, unspecified: Secondary | ICD-10-CM | POA: Diagnosis not present

## 2023-03-24 MED ORDER — ALBUTEROL SULFATE HFA 108 (90 BASE) MCG/ACT IN AERS
1.0000 | INHALATION_SPRAY | RESPIRATORY_TRACT | 0 refills | Status: AC | PRN
  Filled 2023-03-24: qty 6.7, 34d supply, fill #0

## 2023-03-24 MED ORDER — PROMETHAZINE-DM 6.25-15 MG/5ML PO SYRP
5.0000 mL | ORAL_SOLUTION | Freq: Four times a day (QID) | ORAL | 0 refills | Status: AC | PRN
  Filled 2023-03-24: qty 140, 7d supply, fill #0

## 2023-03-24 MED ORDER — PREDNISONE 10 MG PO TABS
ORAL_TABLET | ORAL | 0 refills | Status: AC
  Filled 2023-03-24: qty 21, 6d supply, fill #0

## 2023-04-28 ENCOUNTER — Other Ambulatory Visit (HOSPITAL_COMMUNITY): Payer: Self-pay

## 2023-04-28 MED ORDER — ALBUTEROL SULFATE HFA 108 (90 BASE) MCG/ACT IN AERS
1.0000 | INHALATION_SPRAY | RESPIRATORY_TRACT | 0 refills | Status: AC | PRN
  Filled 2023-04-28: qty 6.7, 34d supply, fill #0

## 2023-04-28 MED ORDER — METOPROLOL TARTRATE 50 MG PO TABS
50.0000 mg | ORAL_TABLET | Freq: Two times a day (BID) | ORAL | 0 refills | Status: AC
  Filled 2023-04-28: qty 60, 30d supply, fill #0

## 2023-05-01 ENCOUNTER — Other Ambulatory Visit (HOSPITAL_COMMUNITY): Payer: Self-pay

## 2023-05-01 DIAGNOSIS — I10 Essential (primary) hypertension: Secondary | ICD-10-CM | POA: Diagnosis not present

## 2023-05-01 MED ORDER — METOPROLOL TARTRATE 50 MG PO TABS
50.0000 mg | ORAL_TABLET | Freq: Two times a day (BID) | ORAL | 1 refills | Status: DC
  Filled 2023-05-01 – 2023-06-06 (×2): qty 180, 90d supply, fill #0
  Filled 2023-11-14: qty 180, 90d supply, fill #1

## 2023-05-01 MED ORDER — CHLORTHALIDONE 25 MG PO TABS
25.0000 mg | ORAL_TABLET | Freq: Every morning | ORAL | 1 refills | Status: AC
  Filled 2023-05-01 – 2023-06-06 (×2): qty 90, 90d supply, fill #0

## 2023-05-09 DIAGNOSIS — H5213 Myopia, bilateral: Secondary | ICD-10-CM | POA: Diagnosis not present

## 2023-05-11 ENCOUNTER — Other Ambulatory Visit (HOSPITAL_COMMUNITY): Payer: Self-pay

## 2023-05-30 ENCOUNTER — Other Ambulatory Visit (HOSPITAL_COMMUNITY): Payer: Self-pay

## 2023-05-30 MED ORDER — CLONIDINE HCL 0.1 MG PO TABS
0.1000 mg | ORAL_TABLET | Freq: Every day | ORAL | 0 refills | Status: AC
  Filled 2023-05-30: qty 30, 30d supply, fill #0

## 2023-06-06 ENCOUNTER — Other Ambulatory Visit (HOSPITAL_COMMUNITY): Payer: Self-pay

## 2023-06-21 ENCOUNTER — Other Ambulatory Visit (HOSPITAL_COMMUNITY): Payer: Self-pay

## 2023-06-21 MED ORDER — ALBUTEROL SULFATE HFA 108 (90 BASE) MCG/ACT IN AERS
1.0000 | INHALATION_SPRAY | RESPIRATORY_TRACT | 0 refills | Status: DC
  Filled 2023-06-21: qty 6.7, 30d supply, fill #0
  Filled 2023-07-12: qty 6.7, 34d supply, fill #0

## 2023-07-03 ENCOUNTER — Other Ambulatory Visit (HOSPITAL_COMMUNITY): Payer: Self-pay

## 2023-07-12 ENCOUNTER — Other Ambulatory Visit (HOSPITAL_COMMUNITY): Payer: Self-pay

## 2023-07-24 DIAGNOSIS — Z113 Encounter for screening for infections with a predominantly sexual mode of transmission: Secondary | ICD-10-CM | POA: Diagnosis not present

## 2023-09-26 ENCOUNTER — Other Ambulatory Visit (HOSPITAL_COMMUNITY): Payer: Self-pay

## 2023-09-26 MED ORDER — CHLORTHALIDONE 25 MG PO TABS
25.0000 mg | ORAL_TABLET | Freq: Every morning | ORAL | 1 refills | Status: AC
  Filled 2023-09-26: qty 90, 90d supply, fill #0

## 2023-09-26 MED ORDER — ALBUTEROL SULFATE HFA 108 (90 BASE) MCG/ACT IN AERS
1.0000 | INHALATION_SPRAY | RESPIRATORY_TRACT | 3 refills | Status: AC | PRN
  Filled 2023-09-26: qty 6.7, 30d supply, fill #0

## 2023-10-30 ENCOUNTER — Other Ambulatory Visit (HOSPITAL_COMMUNITY): Payer: Self-pay

## 2023-10-30 DIAGNOSIS — Z0000 Encounter for general adult medical examination without abnormal findings: Secondary | ICD-10-CM | POA: Diagnosis not present

## 2023-10-30 DIAGNOSIS — I10 Essential (primary) hypertension: Secondary | ICD-10-CM | POA: Diagnosis not present

## 2023-10-30 DIAGNOSIS — Q613 Polycystic kidney, unspecified: Secondary | ICD-10-CM | POA: Diagnosis not present

## 2023-10-30 DIAGNOSIS — J302 Other seasonal allergic rhinitis: Secondary | ICD-10-CM | POA: Diagnosis not present

## 2023-10-30 MED ORDER — METOPROLOL TARTRATE 100 MG PO TABS
100.0000 mg | ORAL_TABLET | Freq: Two times a day (BID) | ORAL | 1 refills | Status: AC
  Filled 2023-10-30 – 2023-11-14 (×2): qty 180, 90d supply, fill #0

## 2023-10-30 MED ORDER — LORATADINE 10 MG PO TABS
10.0000 mg | ORAL_TABLET | Freq: Every day | ORAL | 0 refills | Status: AC
  Filled 2023-10-30: qty 30, 30d supply, fill #0

## 2023-11-09 ENCOUNTER — Other Ambulatory Visit (HOSPITAL_COMMUNITY): Payer: Self-pay

## 2023-11-13 ENCOUNTER — Other Ambulatory Visit (HOSPITAL_COMMUNITY): Payer: Self-pay

## 2023-11-13 MED ORDER — ALBUTEROL SULFATE HFA 108 (90 BASE) MCG/ACT IN AERS
1.0000 | INHALATION_SPRAY | RESPIRATORY_TRACT | 0 refills | Status: AC
  Filled 2023-11-13: qty 6.7, 16d supply, fill #0

## 2023-11-14 ENCOUNTER — Other Ambulatory Visit (HOSPITAL_COMMUNITY): Payer: Self-pay

## 2023-11-15 ENCOUNTER — Other Ambulatory Visit (HOSPITAL_COMMUNITY): Payer: Self-pay

## 2023-12-13 DIAGNOSIS — G8929 Other chronic pain: Secondary | ICD-10-CM | POA: Diagnosis not present

## 2023-12-13 DIAGNOSIS — M25562 Pain in left knee: Secondary | ICD-10-CM | POA: Diagnosis not present

## 2024-01-06 DIAGNOSIS — M25562 Pain in left knee: Secondary | ICD-10-CM | POA: Diagnosis not present

## 2024-01-15 ENCOUNTER — Other Ambulatory Visit (HOSPITAL_COMMUNITY): Payer: Self-pay

## 2024-01-15 MED ORDER — ALBUTEROL SULFATE HFA 108 (90 BASE) MCG/ACT IN AERS
1.0000 | INHALATION_SPRAY | RESPIRATORY_TRACT | 0 refills | Status: AC | PRN
  Filled 2024-01-15: qty 6.7, 34d supply, fill #0

## 2024-01-16 DIAGNOSIS — M25562 Pain in left knee: Secondary | ICD-10-CM | POA: Diagnosis not present

## 2024-01-30 DIAGNOSIS — M25562 Pain in left knee: Secondary | ICD-10-CM | POA: Diagnosis not present

## 2024-02-09 DIAGNOSIS — M25562 Pain in left knee: Secondary | ICD-10-CM | POA: Diagnosis not present

## 2024-02-16 DIAGNOSIS — M25562 Pain in left knee: Secondary | ICD-10-CM | POA: Diagnosis not present

## 2024-02-20 DIAGNOSIS — M25562 Pain in left knee: Secondary | ICD-10-CM | POA: Diagnosis not present

## 2024-02-23 DIAGNOSIS — M25562 Pain in left knee: Secondary | ICD-10-CM | POA: Diagnosis not present

## 2024-03-19 ENCOUNTER — Other Ambulatory Visit (HOSPITAL_COMMUNITY): Payer: Self-pay

## 2024-03-19 MED ORDER — ALBUTEROL SULFATE HFA 108 (90 BASE) MCG/ACT IN AERS
1.0000 | INHALATION_SPRAY | RESPIRATORY_TRACT | 0 refills | Status: AC | PRN
  Filled 2024-03-19: qty 6.7, 34d supply, fill #0

## 2024-04-30 DIAGNOSIS — F411 Generalized anxiety disorder: Secondary | ICD-10-CM | POA: Diagnosis not present

## 2024-04-30 DIAGNOSIS — I10 Essential (primary) hypertension: Secondary | ICD-10-CM | POA: Diagnosis not present

## 2024-05-01 ENCOUNTER — Other Ambulatory Visit (HOSPITAL_COMMUNITY): Payer: Self-pay

## 2024-05-02 ENCOUNTER — Other Ambulatory Visit (HOSPITAL_COMMUNITY): Payer: Self-pay

## 2024-05-02 MED ORDER — SERTRALINE HCL 50 MG PO TABS
50.0000 mg | ORAL_TABLET | Freq: Every morning | ORAL | 0 refills | Status: AC
  Filled 2024-05-02: qty 30, 33d supply, fill #0

## 2024-05-02 MED ORDER — AMLODIPINE BESYLATE 10 MG PO TABS
10.0000 mg | ORAL_TABLET | Freq: Every day | ORAL | 0 refills | Status: DC
  Filled 2024-05-02: qty 30, 30d supply, fill #0

## 2024-05-03 ENCOUNTER — Other Ambulatory Visit (HOSPITAL_COMMUNITY): Payer: Self-pay

## 2024-05-28 ENCOUNTER — Other Ambulatory Visit (HOSPITAL_COMMUNITY): Payer: Self-pay

## 2024-05-28 ENCOUNTER — Other Ambulatory Visit: Payer: Self-pay

## 2024-05-28 MED ORDER — CHLORTHALIDONE 25 MG PO TABS
25.0000 mg | ORAL_TABLET | Freq: Every morning | ORAL | 2 refills | Status: AC
  Filled 2024-05-28: qty 90, 90d supply, fill #0

## 2024-05-28 MED ORDER — ALBUTEROL SULFATE HFA 108 (90 BASE) MCG/ACT IN AERS
1.0000 | INHALATION_SPRAY | RESPIRATORY_TRACT | 1 refills | Status: AC | PRN
  Filled 2024-05-28: qty 6.7, 34d supply, fill #0

## 2024-06-06 ENCOUNTER — Other Ambulatory Visit (HOSPITAL_COMMUNITY): Payer: Self-pay

## 2024-06-06 MED ORDER — AMLODIPINE BESYLATE 10 MG PO TABS
10.0000 mg | ORAL_TABLET | Freq: Every day | ORAL | 1 refills | Status: AC
  Filled 2024-06-06: qty 90, 90d supply, fill #0

## 2024-06-25 ENCOUNTER — Other Ambulatory Visit (HOSPITAL_COMMUNITY): Payer: Self-pay

## 2024-06-25 DIAGNOSIS — F411 Generalized anxiety disorder: Secondary | ICD-10-CM | POA: Diagnosis not present

## 2024-06-25 DIAGNOSIS — I10 Essential (primary) hypertension: Secondary | ICD-10-CM | POA: Diagnosis not present

## 2024-06-25 DIAGNOSIS — J4530 Mild persistent asthma, uncomplicated: Secondary | ICD-10-CM | POA: Diagnosis not present

## 2024-06-25 MED ORDER — BUDESONIDE-FORMOTEROL FUMARATE 160-4.5 MCG/ACT IN AERO
1.0000 | INHALATION_SPRAY | Freq: Two times a day (BID) | RESPIRATORY_TRACT | 3 refills | Status: AC
  Filled 2024-06-25: qty 10.2, 60d supply, fill #0

## 2024-07-04 DIAGNOSIS — Z113 Encounter for screening for infections with a predominantly sexual mode of transmission: Secondary | ICD-10-CM | POA: Diagnosis not present

## 2024-08-15 ENCOUNTER — Other Ambulatory Visit (HOSPITAL_COMMUNITY): Payer: Self-pay

## 2024-08-15 MED ORDER — METOPROLOL TARTRATE 100 MG PO TABS
100.0000 mg | ORAL_TABLET | Freq: Two times a day (BID) | ORAL | 2 refills | Status: AC
  Filled 2024-08-15: qty 180, 90d supply, fill #0

## 2024-08-27 ENCOUNTER — Other Ambulatory Visit (HOSPITAL_COMMUNITY): Payer: Self-pay
